# Patient Record
Sex: Female | Born: 1985 | Race: Black or African American | Hispanic: No | Marital: Married | Smoking: Never smoker
Health system: Southern US, Community
[De-identification: ages and names within clinical notes are randomized; demographics above are authoritative.]

## PROBLEM LIST (undated history)

## (undated) HISTORY — PX: NO PAST SURGERIES: SHX2092

---

## 2018-02-10 ENCOUNTER — Ambulatory Visit: Payer: Medicaid Other | Admitting: Obstetrics and Gynecology

## 2018-02-12 ENCOUNTER — Ambulatory Visit (INDEPENDENT_AMBULATORY_CARE_PROVIDER_SITE_OTHER): Payer: Medicaid Other | Admitting: *Deleted

## 2018-02-12 DIAGNOSIS — Z3201 Encounter for pregnancy test, result positive: Secondary | ICD-10-CM | POA: Diagnosis not present

## 2018-02-12 DIAGNOSIS — Z3200 Encounter for pregnancy test, result unknown: Secondary | ICD-10-CM

## 2018-02-12 LAB — POCT URINE PREGNANCY: Preg Test, Ur: POSITIVE — AB

## 2018-02-12 MED ORDER — PRENATE PIXIE 10-0.6-0.4-200 MG PO CAPS: 1.0000 | ORAL_CAPSULE | Freq: Every day | ORAL | 11 refills | Status: DC

## 2018-02-12 NOTE — Progress Notes (Signed)
Deborah Cardenas presents today for UPT. She has no unusual complaints. LMP: 12/14/17, approx date- pt states end of August    OBJECTIVE: Appears well, in no apparent distress.  OB History   None    Home UPT Result:N/A In-Office UPT result:Positive   ASSESSMENT: Positive pregnancy test  PLAN Prenatal care to be completed at: [facility] PNV ordered to pharmacy today.

## 2018-02-13 NOTE — Progress Notes (Signed)
I have reviewed the chart and agree with nursing staff's documentation of this patient's encounter.  Jaynie Collins, MD 02/13/2018 12:59 AM

## 2018-03-03 ENCOUNTER — Encounter: Payer: Self-pay | Admitting: Obstetrics and Gynecology

## 2018-03-03 ENCOUNTER — Other Ambulatory Visit (HOSPITAL_COMMUNITY)
Admission: RE | Admit: 2018-03-03 | Discharge: 2018-03-03 | Disposition: A | Discharge status: Home / Self Care | Payer: Medicaid Other | Source: Ambulatory Visit | Attending: Obstetrics and Gynecology | Admitting: Obstetrics and Gynecology

## 2018-03-03 ENCOUNTER — Ambulatory Visit (INDEPENDENT_AMBULATORY_CARE_PROVIDER_SITE_OTHER): Payer: Medicaid Other | Admitting: Obstetrics and Gynecology

## 2018-03-03 ENCOUNTER — Ambulatory Visit: Payer: Medicaid Other | Admitting: Obstetrics and Gynecology

## 2018-03-03 DIAGNOSIS — Z3480 Encounter for supervision of other normal pregnancy, unspecified trimester: Secondary | ICD-10-CM | POA: Insufficient documentation

## 2018-03-03 DIAGNOSIS — Z3481 Encounter for supervision of other normal pregnancy, first trimester: Secondary | ICD-10-CM

## 2018-03-03 MED ORDER — VITAFOL ULTRA 29-0.6-0.4-200 MG PO CAPS: 1.0000 | ORAL_CAPSULE | Freq: Every day | ORAL | 12 refills | Status: DC

## 2018-03-03 NOTE — Patient Instructions (Signed)
 First Trimester of Pregnancy The first trimester of pregnancy is from week 1 until the end of week 13 (months 1 through 3). A week after a sperm fertilizes an egg, the egg will implant on the wall of the uterus. This embryo will begin to develop into a baby. Genes from you and your partner will form the baby. The female genes will determine whether the baby will be a boy or a girl. At 6-8 weeks, the eyes and face will be formed, and the heartbeat can be seen on ultrasound. At the end of 12 weeks, all the baby's organs will be formed. Now that you are pregnant, you will want to do everything you can to have a healthy baby. Two of the most important things are to get good prenatal care and to follow your health care provider's instructions. Prenatal care is all the medical care you receive before the baby's birth. This care will help prevent, find, and treat any problems during the pregnancy and childbirth. Body changes during your first trimester Your body goes through many changes during pregnancy. The changes vary from woman to woman.  You may gain or lose a couple of pounds at first.  You may feel sick to your stomach (nauseous) and you may throw up (vomit). If the vomiting is uncontrollable, call your health care provider.  You may tire easily.  You may develop headaches that can be relieved by medicines. All medicines should be approved by your health care provider.  You may urinate more often. Painful urination may mean you have a bladder infection.  You may develop heartburn as a result of your pregnancy.  You may develop constipation because certain hormones are causing the muscles that push stool through your intestines to slow down.  You may develop hemorrhoids or swollen veins (varicose veins).  Your breasts may begin to grow larger and become tender. Your nipples may stick out more, and the tissue that surrounds them (areola) may become darker.  Your gums may bleed and may be  sensitive to brushing and flossing.  Dark spots or blotches (chloasma, mask of pregnancy) may develop on your face. This will likely fade after the baby is born.  Your menstrual periods will stop.  You may have a loss of appetite.  You may develop cravings for certain kinds of food.  You may have changes in your emotions from day to day, such as being excited to be pregnant or being concerned that something may go wrong with the pregnancy and baby.  You may have more vivid and strange dreams.  You may have changes in your hair. These can include thickening of your hair, rapid growth, and changes in texture. Some women also have hair loss during or after pregnancy, or hair that feels dry or thin. Your hair will most likely return to normal after your baby is born.  What to expect at prenatal visits During a routine prenatal visit:  You will be weighed to make sure you and the baby are growing normally.  Your blood pressure will be taken.  Your abdomen will be measured to track your baby's growth.  The fetal heartbeat will be listened to between weeks 10 and 14 of your pregnancy.  Test results from any previous visits will be discussed.  Your health care provider may ask you:  How you are feeling.  If you are feeling the baby move.  If you have had any abnormal symptoms, such as leaking fluid, bleeding, severe   headaches, or abdominal cramping.  If you are using any tobacco products, including cigarettes, chewing tobacco, and electronic cigarettes.  If you have any questions.  Other tests that may be performed during your first trimester include:  Blood tests to find your blood type and to check for the presence of any previous infections. The tests will also be used to check for low iron levels (anemia) and protein on red blood cells (Rh antibodies). Depending on your risk factors, or if you previously had diabetes during pregnancy, you may have tests to check for high blood  sugar that affects pregnant women (gestational diabetes).  Urine tests to check for infections, diabetes, or protein in the urine.  An ultrasound to confirm the proper growth and development of the baby.  Fetal screens for spinal cord problems (spina bifida) and Down syndrome.  HIV (human immunodeficiency virus) testing. Routine prenatal testing includes screening for HIV, unless you choose not to have this test.  You may need other tests to make sure you and the baby are doing well.  Follow these instructions at home: Medicines  Follow your health care provider's instructions regarding medicine use. Specific medicines may be either safe or unsafe to take during pregnancy.  Take a prenatal vitamin that contains at least 600 micrograms (mcg) of folic acid.  If you develop constipation, try taking a stool softener if your health care provider approves. Eating and drinking  Eat a balanced diet that includes fresh fruits and vegetables, whole grains, good sources of protein such as meat, eggs, or tofu, and low-fat dairy. Your health care provider will help you determine the amount of weight gain that is right for you.  Avoid raw meat and uncooked cheese. These carry germs that can cause birth defects in the baby.  Eating four or five small meals rather than three large meals a day may help relieve nausea and vomiting. If you start to feel nauseous, eating a few soda crackers can be helpful. Drinking liquids between meals, instead of during meals, also seems to help ease nausea and vomiting.  Limit foods that are high in fat and processed sugars, such as fried and sweet foods.  To prevent constipation: ? Eat foods that are high in fiber, such as fresh fruits and vegetables, whole grains, and beans. ? Drink enough fluid to keep your urine clear or pale yellow. Activity  Exercise only as directed by your health care provider. Most women can continue their usual exercise routine during  pregnancy. Try to exercise for 30 minutes at least 5 days a week. Exercising will help you: ? Control your weight. ? Stay in shape. ? Be prepared for labor and delivery.  Experiencing pain or cramping in the lower abdomen or lower back is a good sign that you should stop exercising. Check with your health care provider before continuing with normal exercises.  Try to avoid standing for long periods of time. Move your legs often if you must stand in one place for a long time.  Avoid heavy lifting.  Wear low-heeled shoes and practice good posture.  You may continue to have sex unless your health care provider tells you not to. Relieving pain and discomfort  Wear a good support bra to relieve breast tenderness.  Take warm sitz baths to soothe any pain or discomfort caused by hemorrhoids. Use hemorrhoid cream if your health care provider approves.  Rest with your legs elevated if you have leg cramps or low back pain.  If you   develop varicose veins in your legs, wear support hose. Elevate your feet for 15 minutes, 3-4 times a day. Limit salt in your diet. Prenatal care  Schedule your prenatal visits by the twelfth week of pregnancy. They are usually scheduled monthly at first, then more often in the last 2 months before delivery.  Write down your questions. Take them to your prenatal visits.  Keep all your prenatal visits as told by your health care provider. This is important. Safety  Wear your seat belt at all times when driving.  Make a list of emergency phone numbers, including numbers for family, friends, the hospital, and police and fire departments. General instructions  Ask your health care provider for a referral to a local prenatal education class. Begin classes no later than the beginning of month 6 of your pregnancy.  Ask for help if you have counseling or nutritional needs during pregnancy. Your health care provider can offer advice or refer you to specialists for help  with various needs.  Do not use hot tubs, steam rooms, or saunas.  Do not douche or use tampons or scented sanitary pads.  Do not cross your legs for long periods of time.  Avoid cat litter boxes and soil used by cats. These carry germs that can cause birth defects in the baby and possibly loss of the fetus by miscarriage or stillbirth.  Avoid all smoking, herbs, alcohol, and medicines not prescribed by your health care provider. Chemicals in these products affect the formation and growth of the baby.  Do not use any products that contain nicotine or tobacco, such as cigarettes and e-cigarettes. If you need help quitting, ask your health care provider. You may receive counseling support and other resources to help you quit.  Schedule a dentist appointment. At home, brush your teeth with a soft toothbrush and be gentle when you floss. Contact a health care provider if:  You have dizziness.  You have mild pelvic cramps, pelvic pressure, or nagging pain in the abdominal area.  You have persistent nausea, vomiting, or diarrhea.  You have a bad smelling vaginal discharge.  You have pain when you urinate.  You notice increased swelling in your face, hands, legs, or ankles.  You are exposed to fifth disease or chickenpox.  You are exposed to German measles (rubella) and have never had it. Get help right away if:  You have a fever.  You are leaking fluid from your vagina.  You have spotting or bleeding from your vagina.  You have severe abdominal cramping or pain.  You have rapid weight gain or loss.  You vomit blood or material that looks like coffee grounds.  You develop a severe headache.  You have shortness of breath.  You have any kind of trauma, such as from a fall or a car accident. Summary  The first trimester of pregnancy is from week 1 until the end of week 13 (months 1 through 3).  Your body goes through many changes during pregnancy. The changes vary from  woman to woman.  You will have routine prenatal visits. During those visits, your health care provider will examine you, discuss any test results you may have, and talk with you about how you are feeling. This information is not intended to replace advice given to you by your health care provider. Make sure you discuss any questions you have with your health care provider. Document Released: 04/02/2001 Document Revised: 03/20/2016 Document Reviewed: 03/20/2016 [employer] Interactive Patient Education  2018   [employer]   Second Trimester of Pregnancy The second trimester is from week 14 through week 27 (months 4 through 6). The second trimester is often a time when you feel your best. Your body has adjusted to being pregnant, and you begin to feel better physically. Usually, morning sickness has lessened or quit completely, you may have more energy, and you may have an increase in appetite. The second trimester is also a time when the fetus is growing rapidly. At the end of the sixth month, the fetus is about 9 inches long and weighs about 1 pounds. You will likely begin to feel the baby move (quickening) between 16 and 20 weeks of pregnancy. Body changes during your second trimester Your body continues to go through many changes during your second trimester. The changes vary from woman to woman.  Your weight will continue to increase. You will notice your lower abdomen bulging out.  You may begin to get stretch marks on your hips, abdomen, and breasts.  You may develop headaches that can be relieved by medicines. The medicines should be approved by your health care provider.  You may urinate more often because the fetus is pressing on your bladder.  You may develop or continue to have heartburn as a result of your pregnancy.  You may develop constipation because certain hormones are causing the muscles that push waste through your intestines to slow down.  You may develop hemorrhoids or  swollen, bulging veins (varicose veins).  You may have back pain. This is caused by: ? Weight gain. ? Pregnancy hormones that are relaxing the joints in your pelvis. ? A shift in weight and the muscles that support your balance.  Your breasts will continue to grow and they will continue to become tender.  Your gums may bleed and may be sensitive to brushing and flossing.  Dark spots or blotches (chloasma, mask of pregnancy) may develop on your face. This will likely fade after the baby is born.  A dark line from your belly button to the pubic area (linea nigra) may appear. This will likely fade after the baby is born.  You may have changes in your hair. These can include thickening of your hair, rapid growth, and changes in texture. Some women also have hair loss during or after pregnancy, or hair that feels dry or thin. Your hair will most likely return to normal after your baby is born.  What to expect at prenatal visits During a routine prenatal visit:  You will be weighed to make sure you and the fetus are growing normally.  Your blood pressure will be taken.  Your abdomen will be measured to track your baby's growth.  The fetal heartbeat will be listened to.  Any test results from the previous visit will be discussed.  Your health care provider may ask you:  How you are feeling.  If you are feeling the baby move.  If you have had any abnormal symptoms, such as leaking fluid, bleeding, severe headaches, or abdominal cramping.  If you are using any tobacco products, including cigarettes, chewing tobacco, and electronic cigarettes.  If you have any questions.  Other tests that may be performed during your second trimester include:  Blood tests that check for: ? Low iron levels (anemia). ? High blood sugar that affects pregnant women (gestational diabetes) between 24 and 28 weeks. ? Rh antibodies. This is to check for a protein on red blood cells (Rh factor).  Urine  tests to   check for infections, diabetes, or protein in the urine.  An ultrasound to confirm the proper growth and development of the baby.  An amniocentesis to check for possible genetic problems.  Fetal screens for spina bifida and Down syndrome.  HIV (human immunodeficiency virus) testing. Routine prenatal testing includes screening for HIV, unless you choose not to have this test.  Follow these instructions at home: Medicines  Follow your health care provider's instructions regarding medicine use. Specific medicines may be either safe or unsafe to take during pregnancy.  Take a prenatal vitamin that contains at least 600 micrograms (mcg) of folic acid.  If you develop constipation, try taking a stool softener if your health care provider approves. Eating and drinking  Eat a balanced diet that includes fresh fruits and vegetables, whole grains, good sources of protein such as meat, eggs, or tofu, and low-fat dairy. Your health care provider will help you determine the amount of weight gain that is right for you.  Avoid raw meat and uncooked cheese. These carry germs that can cause birth defects in the baby.  If you have low calcium intake from food, talk to your health care provider about whether you should take a daily calcium supplement.  Limit foods that are high in fat and processed sugars, such as fried and sweet foods.  To prevent constipation: ? Drink enough fluid to keep your urine clear or pale yellow. ? Eat foods that are high in fiber, such as fresh fruits and vegetables, whole grains, and beans. Activity  Exercise only as directed by your health care provider. Most women can continue their usual exercise routine during pregnancy. Try to exercise for 30 minutes at least 5 days a week. Stop exercising if you experience uterine contractions.  Avoid heavy lifting, wear low heel shoes, and practice good posture.  A sexual relationship may be continued unless your health  care provider directs you otherwise. Relieving pain and discomfort  Wear a good support bra to prevent discomfort from breast tenderness.  Take warm sitz baths to soothe any pain or discomfort caused by hemorrhoids. Use hemorrhoid cream if your health care provider approves.  Rest with your legs elevated if you have leg cramps or low back pain.  If you develop varicose veins, wear support hose. Elevate your feet for 15 minutes, 3-4 times a day. Limit salt in your diet. Prenatal Care  Write down your questions. Take them to your prenatal visits.  Keep all your prenatal visits as told by your health care provider. This is important. Safety  Wear your seat belt at all times when driving.  Make a list of emergency phone numbers, including numbers for family, friends, the hospital, and police and fire departments. General instructions  Ask your health care provider for a referral to a local prenatal education class. Begin classes no later than the beginning of month 6 of your pregnancy.  Ask for help if you have counseling or nutritional needs during pregnancy. Your health care provider can offer advice or refer you to specialists for help with various needs.  Do not use hot tubs, steam rooms, or saunas.  Do not douche or use tampons or scented sanitary pads.  Do not cross your legs for long periods of time.  Avoid cat litter boxes and soil used by cats. These carry germs that can cause birth defects in the baby and possibly loss of the fetus by miscarriage or stillbirth.  Avoid all smoking, herbs, alcohol, and unprescribed drugs. Chemicals   in these products can affect the formation and growth of the baby.  Do not use any products that contain nicotine or tobacco, such as cigarettes and e-cigarettes. If you need help quitting, ask your health care provider.  Visit your dentist if you have not gone yet during your pregnancy. Use a soft toothbrush to brush your teeth and be gentle when  you floss. Contact a health care provider if:  You have dizziness.  You have mild pelvic cramps, pelvic pressure, or nagging pain in the abdominal area.  You have persistent nausea, vomiting, or diarrhea.  You have a bad smelling vaginal discharge.  You have pain when you urinate. Get help right away if:  You have a fever.  You are leaking fluid from your vagina.  You have spotting or bleeding from your vagina.  You have severe abdominal cramping or pain.  You have rapid weight gain or weight loss.  You have shortness of breath with chest pain.  You notice sudden or extreme swelling of your face, hands, ankles, feet, or legs.  You have not felt your baby move in over an hour.  You have severe headaches that do not go away when you take medicine.  You have vision changes. Summary  The second trimester is from week 14 through week 27 (months 4 through 6). It is also a time when the fetus is growing rapidly.  Your body goes through many changes during pregnancy. The changes vary from woman to woman.  Avoid all smoking, herbs, alcohol, and unprescribed drugs. These chemicals affect the formation and growth your baby.  Do not use any tobacco products, such as cigarettes, chewing tobacco, and e-cigarettes. If you need help quitting, ask your health care provider.  Contact your health care provider if you have any questions. Keep all prenatal visits as told by your health care provider. This is important. This information is not intended to replace advice given to you by your health care provider. Make sure you discuss any questions you have with your health care provider. Document Released: 04/02/2001 Document Revised: 05/14/2016 Document Reviewed: 05/14/2016 [employer] Interactive Patient Education  2018 [employer]   Contraception Choices Contraception, also called birth control, refers to methods or devices that prevent pregnancy. Hormonal methods Contraceptive  implant A contraceptive implant is a thin, plastic tube that contains a hormone. It is inserted into the upper part of the arm. It can remain in place for up to 3 years. Progestin-only injections Progestin-only injections are injections of progestin, a synthetic form of the hormone progesterone. They are given every 3 months by a health care provider. Birth control pills Birth control pills are pills that contain hormones that prevent pregnancy. They must be taken once a day, preferably at the same time each day. Birth control patch The birth control patch contains hormones that prevent pregnancy. It is placed on the skin and must be changed once a week for three weeks and removed on the fourth week. A prescription is needed to use this method of contraception. Vaginal ring A vaginal ring contains hormones that prevent pregnancy. It is placed in the vagina for three weeks and removed on the fourth week. After that, the process is repeated with a new ring. A prescription is needed to use this method of contraception. Emergency contraceptive Emergency contraceptives prevent pregnancy after unprotected sex. They come in pill form and can be taken up to 5 days after sex. They work best the sooner they are taken after having   sex. Most emergency contraceptives are available without a prescription. This method should not be used as your only form of birth control. Barrier methods Female condom A female condom is a thin sheath that is worn over the penis during sex. Condoms keep sperm from going inside a woman's body. They can be used with a spermicide to increase their effectiveness. They should be disposed after a single use. Female condom A female condom is a soft, loose-fitting sheath that is put into the vagina before sex. The condom keeps sperm from going inside a woman's body. They should be disposed after a single use. Diaphragm A diaphragm is a soft, dome-shaped barrier. It is inserted into the vagina  before sex, along with a spermicide. The diaphragm blocks sperm from entering the uterus, and the spermicide kills sperm. A diaphragm should be left in the vagina for 6-8 hours after sex and removed within 24 hours. A diaphragm is prescribed and fitted by a health care provider. A diaphragm should be replaced every 1-2 years, after giving birth, after gaining more than 15 lb (6.8 kg), and after pelvic surgery. Cervical cap A cervical cap is a round, soft latex or plastic cup that fits over the cervix. It is inserted into the vagina before sex, along with spermicide. It blocks sperm from entering the uterus. The cap should be left in place for 6-8 hours after sex and removed within 48 hours. A cervical cap must be prescribed and fitted by a health care provider. It should be replaced every 2 years. Sponge A sponge is a soft, circular piece of polyurethane foam with spermicide on it. The sponge helps block sperm from entering the uterus, and the spermicide kills sperm. To use it, you make it wet and then insert it into the vagina. It should be inserted before sex, left in for at least 6 hours after sex, and removed and thrown away within 30 hours. Spermicides Spermicides are chemicals that kill or block sperm from entering the cervix and uterus. They can come as a cream, jelly, suppository, foam, or tablet. A spermicide should be inserted into the vagina with an applicator at least 10-15 minutes before sex to allow time for it to work. The process must be repeated every time you have sex. Spermicides do not require a prescription. Intrauterine contraception Intrauterine device (IUD) An IUD is a T-shaped device that is put in a woman's uterus. There are two types:  Hormone IUD.This type contains progestin, a synthetic form of the hormone progesterone. This type can stay in place for 3-5 years.  Copper IUD.This type is wrapped in copper wire. It can stay in place for 10 years.  Permanent methods of  contraception Female tubal ligation In this method, a woman's fallopian tubes are sealed, tied, or blocked during surgery to prevent eggs from traveling to the uterus. Hysteroscopic sterilization In this method, a small, flexible insert is placed into each fallopian tube. The inserts cause scar tissue to form in the fallopian tubes and block them, so sperm cannot reach an egg. The procedure takes about 3 months to be effective. Another form of birth control must be used during those 3 months. Female sterilization This is a procedure to tie off the tubes that carry sperm (vasectomy). After the procedure, the man can still ejaculate fluid (semen). Natural planning methods Natural family planning In this method, a couple does not have sex on days when the woman could become pregnant. Calendar method This means keeping track of the   length of each menstrual cycle, identifying the days when pregnancy can happen, and not having sex on those days. Ovulation method In this method, a couple avoids sex during ovulation. Symptothermal method This method involves not having sex during ovulation. The woman typically checks for ovulation by watching changes in her temperature and in the consistency of cervical mucus. Post-ovulation method In this method, a couple waits to have sex until after ovulation. Summary  Contraception, also called birth control, means methods or devices that prevent pregnancy.  Hormonal methods of contraception include implants, injections, pills, patches, vaginal rings, and emergency contraceptives.  Barrier methods of contraception can include female condoms, female condoms, diaphragms, cervical caps, sponges, and spermicides.  There are two types of IUDs (intrauterine devices). An IUD can be put in a woman's uterus to prevent pregnancy for 3-5 years.  Permanent sterilization can be done through a procedure for males, females, or both.  Natural family planning methods involve  not having sex on days when the woman could become pregnant. This information is not intended to replace advice given to you by your health care provider. Make sure you discuss any questions you have with your health care provider. Document Released: 04/08/2005 Document Revised: 05/11/2016 Document Reviewed: 05/11/2016 Elsevier Interactive Patient Education  2018 Else[employer] Inc.   Breastfeeding Choosing to breastfeed is one of the best decisions you can make for yourself and your baby. A change in hormones during pregnancy causes your breasts to make breast milk in your milk-producing glands. Hormones prevent breast milk from being released before your baby is born. They also prompt milk flow after birth. Once breastfeeding has begun, thoughts of your baby, as well as his or her sucking or crying, can stimulate the release of milk from your milk-producing glands. Benefits of breastfeeding Research shows that breastfeeding offers many health benefits for infants and mothers. It also offers a cost-free and convenient way to feed your baby. For your baby  Your first milk (colostrum) helps your baby's digestive system to function better.  Special cells in your milk (antibodies) help your baby to fight off infections.  Breastfed babies are less likely to develop asthma, allergies, obesity, or type 2 diabetes. They are also at lower risk for sudden infant death syndrome (SIDS).  Nutrients in breast milk are better able to meet your baby's needs compared to infant formula.  Breast milk improves your baby's brain development. For you  Breastfeeding helps to create a very special bond between you and your baby.  Breastfeeding is convenient. Breast milk costs nothing and is always available at the correct temperature.  Breastfeeding helps to burn calories. It helps you to lose the weight that you gained during pregnancy.  Breastfeeding makes your uterus return faster to its size before pregnancy.  It also slows bleeding (lochia) after you give birth.  Breastfeeding helps to lower your risk of developing type 2 diabetes, osteoporosis, rheumatoid arthritis, cardiovascular disease, and breast, ovarian, uterine, and endometrial cancer later in life. Breastfeeding basics Starting breastfeeding  Find a comfortable place to sit or lie down, with your neck and back well-supported.  Place a pillow or a rolled-up blanket under your baby to bring him or her to the level of your breast (if you are seated). Nursing pillows are specially designed to help support your arms and your baby while you breastfeed.  Make sure that your baby's tummy (abdomen) is facing your abdomen.  Gently massage your breast. With your fingertips, massage from the outer edges of   your breast inward toward the nipple. This encourages milk flow. If your milk flows slowly, you may need to continue this action during the feeding.  Support your breast with 4 fingers underneath and your thumb above your nipple (make the letter "C" with your hand). Make sure your fingers are well away from your nipple and your baby's mouth.  Stroke your baby's lips gently with your finger or nipple.  When your baby's mouth is open wide enough, quickly bring your baby to your breast, placing your entire nipple and as much of the areola as possible into your baby's mouth. The areola is the colored area around your nipple. ? More areola should be visible above your baby's upper lip than below the lower lip. ? Your baby's lips should be opened and extended outward (flanged) to ensure an adequate, comfortable latch. ? Your baby's tongue should be between his or her lower gum and your breast.  Make sure that your baby's mouth is correctly positioned around your nipple (latched). Your baby's lips should create a seal on your breast and be turned out (everted).  It is common for your baby to suck about 2-3 minutes in order to start the flow of breast  milk. Latching Teaching your baby how to latch onto your breast properly is very important. An improper latch can cause nipple pain, decreased milk supply, and poor weight gain in your baby. Also, if your baby is not latched onto your nipple properly, he or she may swallow some air during feeding. This can make your baby fussy. Burping your baby when you switch breasts during the feeding can help to get rid of the air. However, teaching your baby to latch on properly is still the best way to prevent fussiness from swallowing air while breastfeeding. Signs that your baby has successfully latched onto your nipple  Silent tugging or silent sucking, without causing you pain. Infant's lips should be extended outward (flanged).  Swallowing heard between every 3-4 sucks once your milk has started to flow (after your let-down milk reflex occurs).  Muscle movement above and in front of his or her ears while sucking.  Signs that your baby has not successfully latched onto your nipple  Sucking sounds or smacking sounds from your baby while breastfeeding.  Nipple pain.  If you think your baby has not latched on correctly, slip your finger into the corner of your baby's mouth to break the suction and place it between your baby's gums. Attempt to start breastfeeding again. Signs of successful breastfeeding Signs from your baby  Your baby will gradually decrease the number of sucks or will completely stop sucking.  Your baby will fall asleep.  Your baby's body will relax.  Your baby will retain a small amount of milk in his or her mouth.  Your baby will let go of your breast by himself or herself.  Signs from you  Breasts that have increased in firmness, weight, and size 1-3 hours after feeding.  Breasts that are softer immediately after breastfeeding.  Increased milk volume, as well as a change in milk consistency and color by the fifth day of breastfeeding.  Nipples that are not sore,  cracked, or bleeding.  Signs that your baby is getting enough milk  Wetting at least 1-2 diapers during the first 24 hours after birth.  Wetting at least 5-6 diapers every 24 hours for the first week after birth. The urine should be clear or pale yellow by the age of 5   days.  Wetting 6-8 diapers every 24 hours as your baby continues to grow and develop.  At least 3 stools in a 24-hour period by the age of 5 days. The stool should be soft and yellow.  At least 3 stools in a 24-hour period by the age of 7 days. The stool should be seedy and yellow.  No loss of weight greater than 10% of birth weight during the first 3 days of life.  Average weight gain of 4-7 oz (113-198 g) per week after the age of 4 days.  Consistent daily weight gain by the age of 5 days, without weight loss after the age of 2 weeks. After a feeding, your baby may spit up a small amount of milk. This is normal. Breastfeeding frequency and duration Frequent feeding will help you make more milk and can prevent sore nipples and extremely full breasts (breast engorgement). Breastfeed when you feel the need to reduce the fullness of your breasts or when your baby shows signs of hunger. This is called "breastfeeding on demand." Signs that your baby is hungry include:  Increased alertness, activity, or restlessness.  Movement of the head from side to side.  Opening of the mouth when the corner of the mouth or cheek is stroked (rooting).  Increased sucking sounds, smacking lips, cooing, sighing, or squeaking.  Hand-to-mouth movements and sucking on fingers or hands.  Fussing or crying.  Avoid introducing a pacifier to your baby in the first 4-6 weeks after your baby is born. After this time, you may choose to use a pacifier. Research has shown that pacifier use during the first year of a baby's life decreases the risk of sudden infant death syndrome (SIDS). Allow your baby to feed on each breast as long as he or she  wants. When your baby unlatches or falls asleep while feeding from the first breast, offer the second breast. Because newborns are often sleepy in the first few weeks of life, you may need to awaken your baby to get him or her to feed. Breastfeeding times will vary from baby to baby. However, the following rules can serve as a guide to help you make sure that your baby is properly fed:  Newborns (babies 4 weeks of age or younger) may breastfeed every 1-3 hours.  Newborns should not go without breastfeeding for longer than 3 hours during the day or 5 hours during the night.  You should breastfeed your baby a minimum of 8 times in a 24-hour period.  Breast milk pumping Pumping and storing breast milk allows you to make sure that your baby is exclusively fed your breast milk, even at times when you are unable to breastfeed. This is especially important if you go back to work while you are still breastfeeding, or if you are not able to be present during feedings. Your lactation consultant can help you find a method of pumping that works best for you and give you guidelines about how long it is safe to store breast milk. Caring for your breasts while you breastfeed Nipples can become dry, cracked, and sore while breastfeeding. The following recommendations can help keep your breasts moisturized and healthy:  Avoid using soap on your nipples.  Wear a supportive bra designed especially for nursing. Avoid wearing underwire-style bras or extremely tight bras (sports bras).  Air-dry your nipples for 3-4 minutes after each feeding.  Use only cotton bra pads to absorb leaked breast milk. Leaking of breast milk between feedings is normal.    Use lanolin on your nipples after breastfeeding. Lanolin helps to maintain your skin's normal moisture barrier. Pure lanolin is not harmful (not toxic) to your baby. You may also hand express a few drops of breast milk and gently massage that milk into your nipples and  allow the milk to air-dry.  In the first few weeks after giving birth, some women experience breast engorgement. Engorgement can make your breasts feel heavy, warm, and tender to the touch. Engorgement peaks within 3-5 days after you give birth. The following recommendations can help to ease engorgement:  Completely empty your breasts while breastfeeding or pumping. You may want to start by applying warm, moist heat (in the shower or with warm, water-soaked hand towels) just before feeding or pumping. This increases circulation and helps the milk flow. If your baby does not completely empty your breasts while breastfeeding, pump any extra milk after he or she is finished.  Apply ice packs to your breasts immediately after breastfeeding or pumping, unless this is too uncomfortable for you. To do this: ? Put ice in a plastic bag. ? Place a towel between your skin and the bag. ? Leave the ice on for 20 minutes, 2-3 times a day.  Make sure that your baby is latched on and positioned properly while breastfeeding.  If engorgement persists after 48 hours of following these recommendations, contact your health care provider or a lactation consultant. Overall health care recommendations while breastfeeding  Eat 3 healthy meals and 3 snacks every day. Well-nourished mothers who are breastfeeding need an additional 450-500 calories a day. You can meet this requirement by increasing the amount of a balanced diet that you eat.  Drink enough water to keep your urine pale yellow or clear.  Rest often, relax, and continue to take your prenatal vitamins to prevent fatigue, stress, and low vitamin and mineral levels in your body (nutrient deficiencies).  Do not use any products that contain nicotine or tobacco, such as cigarettes and e-cigarettes. Your baby may be harmed by chemicals from cigarettes that pass into breast milk and exposure to secondhand smoke. If you need help quitting, ask your health care  provider.  Avoid alcohol.  Do not use illegal drugs or marijuana.  Talk with your health care provider before taking any medicines. These include over-the-counter and prescription medicines as well as vitamins and herbal supplements. Some medicines that may be harmful to your baby can pass through breast milk.  It is possible to become pregnant while breastfeeding. If birth control is desired, ask your health care provider about options that will be safe while breastfeeding your baby. Where to find more information: [employer]: www.llli.org Contact a health care provider if:  You feel like you want to stop breastfeeding or have become frustrated with breastfeeding.  Your nipples are cracked or bleeding.  Your breasts are red, tender, or warm.  You have: ? Painful breasts or nipples. ? A swollen area on either breast. ? A fever or chills. ? Nausea or vomiting. ? Drainage other than breast milk from your nipples.  Your breasts do not become full before feedings by the fifth day after you give birth.  You feel sad and depressed.  Your baby is: ? Too sleepy to eat well. ? Having trouble sleeping. ? More than 1 week old and wetting fewer than 6 diapers in a 24-hour period. ? Not gaining weight by 5 days of age.  Your baby has fewer than 3 stools in a   24-hour period.  Your baby's skin or the white parts of his or her eyes become yellow. Get help right away if:  Your baby is overly tired (lethargic) and does not want to wake up and feed.  Your baby develops an unexplained fever. Summary  Breastfeeding offers many health benefits for infant and mothers.  Try to breastfeed your infant when he or she shows early signs of hunger.  Gently tickle or stroke your baby's lips with your finger or nipple to allow the baby to open his or her mouth. Bring the baby to your breast. Make sure that much of the areola is in your baby's mouth. Offer one side and burp the  baby before you offer the other side.  Talk with your health care provider or lactation consultant if you have questions or you face problems as you breastfeed. This information is not intended to replace advice given to you by your health care provider. Make sure you discuss any questions you have with your health care provider. Document Released: 04/08/2005 Document Revised: 05/10/2016 Document Reviewed: 05/10/2016 Elsevier Interactive Patient Education  2018 Elsevier Inc.  

## 2018-03-03 NOTE — Progress Notes (Signed)
  Subjective:    Deborah Cardenas is a G1P0 6648w2d being seen today for her first obstetrical visit.  Her obstetrical history is significant for previous uncomplicated full term vaginal delivery . Patient does intend to breast feed. Pregnancy history fully reviewed.  Patient reports nausea.  Vitals:   03/03/18 0837  BP: 116/65  Pulse: 79  Weight: 144 lb (65.3 kg)    HISTORY: OB History  Gravida Para Term Preterm AB Living  2 1 1     1   SAB TAB Ectopic Multiple Live Births          1    # Outcome Date GA Lbr Len/2nd Weight Sex Delivery Anes PTL Lv  2 Current           1 Term 06/19/15    M Vag-Spont   LIV   History reviewed. No pertinent past medical history. Past Surgical History:  Procedure Laterality Date  . NO PAST SURGERIES     History reviewed. No pertinent family history.   Exam    Uterus:  Fundal Height: 14 cm  Pelvic Exam:    Perineum: No Hemorrhoids, Normal Perineum   Vulva: normal   Vagina:  normal mucosa, normal discharge   pH:    Cervix: nulliparous appearance and cervix is closed and long   Adnexa: normal adnexa and no mass, fullness, tenderness   Bony Pelvis: gynecoid  System: Breast:  normal appearance, no masses or tenderness   Skin: normal coloration and turgor, no rashes    Neurologic: oriented, no focal deficits   Extremities: normal strength, tone, and muscle mass   HEENT extra ocular movement intact   Mouth/Teeth mucous membranes moist, pharynx normal without lesions and dental hygiene good   Neck supple and no masses   Cardiovascular: regular rate and rhythm   Respiratory:  appears well, vitals normal, no respiratory distress, acyanotic, normal RR, chest clear, no wheezing, crepitations, rhonchi, normal symmetric air entry   Abdomen: soft, non-tender; bowel sounds normal; no masses,  no organomegaly   Urinary:       Assessment:    Pregnancy: G1P0 Patient Active Problem List   Diagnosis Date Noted  . Supervision of other normal  pregnancy, antepartum 03/03/2018        Plan:     Initial labs drawn. Prenatal vitamins. Problem list reviewed and updated. Genetic Screening discussed : panorama ordered.  Ultrasound discussed; fetal survey: ordered.  Follow up in 4 weeks. 50% of 30 min visit spent on counseling and coordination of care.     Madisin Hasan 03/03/2018

## 2018-03-04 NOTE — Progress Notes (Signed)
Patient was not seen.

## 2018-03-05 LAB — URINE CULTURE, OB REFLEX

## 2018-03-05 LAB — CULTURE, OB URINE

## 2018-03-06 LAB — CYTOLOGY - PAP
Chlamydia: NEGATIVE
Diagnosis: NEGATIVE
HPV (WINDOPATH): NOT DETECTED
NEISSERIA GONORRHEA: NEGATIVE

## 2018-03-10 ENCOUNTER — Encounter: Payer: Self-pay | Admitting: Obstetrics and Gynecology

## 2018-03-11 LAB — OBSTETRIC PANEL, INCLUDING HIV
Antibody Screen: NEGATIVE
BASOS ABS: 0 10*3/uL (ref 0.0–0.2)
Basos: 0 %
EOS (ABSOLUTE): 0 10*3/uL (ref 0.0–0.4)
Eos: 1 %
HEP B S AG: NEGATIVE
HIV SCREEN 4TH GENERATION: NONREACTIVE
Hematocrit: 35.3 % (ref 34.0–46.6)
Hemoglobin: 12.3 g/dL (ref 11.1–15.9)
Immature Grans (Abs): 0 10*3/uL (ref 0.0–0.1)
Immature Granulocytes: 0 %
Lymphocytes Absolute: 1.6 10*3/uL (ref 0.7–3.1)
Lymphs: 23 %
MCH: 32 pg (ref 26.6–33.0)
MCHC: 34.8 g/dL (ref 31.5–35.7)
MCV: 92 fL (ref 79–97)
MONOCYTES: 3 %
Monocytes Absolute: 0.2 10*3/uL (ref 0.1–0.9)
NEUTROS ABS: 5.2 10*3/uL (ref 1.4–7.0)
Neutrophils: 73 %
Platelets: 262 10*3/uL (ref 150–450)
RBC: 3.84 x10E6/uL (ref 3.77–5.28)
RDW: 13.1 % (ref 12.3–15.4)
RPR: NONREACTIVE
RUBELLA: 27.8 {index} (ref >0.99)
Rh Factor: POSITIVE
WBC: 7.1 10*3/uL (ref 3.4–10.8)

## 2018-03-11 LAB — HEMOGLOBINOPATHY EVALUATION
HGB C: 0 %
HGB S: 0 %
HGB VARIANT: 0 %
Hemoglobin A2 Quantitation: 2.4 % (ref 1.8–3.2)
Hemoglobin F Quantitation: 0 % (ref 0.0–2.0)
Hgb A: 97.6 % (ref 96.4–98.8)

## 2018-03-11 LAB — SMN1 COPY NUMBER ANALYSIS (SMA CARRIER SCREENING)

## 2018-03-11 LAB — CYSTIC FIBROSIS MUTATION 97: Interpretation: NOT DETECTED

## 2018-03-31 ENCOUNTER — Encounter: Payer: Self-pay | Admitting: Obstetrics and Gynecology

## 2018-03-31 ENCOUNTER — Ambulatory Visit (INDEPENDENT_AMBULATORY_CARE_PROVIDER_SITE_OTHER): Payer: Medicaid Other | Admitting: Obstetrics and Gynecology

## 2018-03-31 VITALS — BP 113/70 | HR 96 | Wt 157.0 lb

## 2018-03-31 DIAGNOSIS — Z3482 Encounter for supervision of other normal pregnancy, second trimester: Secondary | ICD-10-CM

## 2018-03-31 DIAGNOSIS — Z789 Other specified health status: Secondary | ICD-10-CM

## 2018-03-31 DIAGNOSIS — Z3480 Encounter for supervision of other normal pregnancy, unspecified trimester: Secondary | ICD-10-CM

## 2018-03-31 DIAGNOSIS — Z758 Other problems related to medical facilities and other health care: Secondary | ICD-10-CM | POA: Insufficient documentation

## 2018-03-31 NOTE — Progress Notes (Signed)
Pt states she wants to discuss results.

## 2018-03-31 NOTE — Progress Notes (Signed)
   PRENATAL VISIT NOTE  Subjective:  Deborah Cardenas is a 32 y.o. G2P1001 at 8166w2d being seen today for ongoing prenatal care.  She is currently monitored for the following issues for this low-risk pregnancy and has Supervision of other normal pregnancy, antepartum and Language barrier on their problem list.  Patient reports no complaints.  Contractions: Not present. Vag. Bleeding: None.  Movement: Absent. Denies leaking of fluid.   The following portions of the patient's history were reviewed and updated as appropriate: allergies, current medications, past family history, past medical history, past social history, past surgical history and problem list. Problem list updated.  Objective:   Vitals:   03/31/18 0845  BP: 113/70  Pulse: 96  Weight: 157 lb (71.2 kg)    Fetal Status: Fetal Heart Rate (bpm): 144   Movement: Absent     General:  Alert, oriented and cooperative. Patient is in no acute distress.  Skin: Skin is warm and dry. No rash noted.   Cardiovascular: Normal heart rate noted  Respiratory: Normal respiratory effort, no problems with respiration noted  Abdomen: Soft, gravid, appropriate for gestational age.  Pain/Pressure: Absent     Pelvic: Cervical exam deferred        Extremities: Normal range of motion.  Edema: None  Mental Status: Normal mood and affect. Normal behavior. Normal judgment and thought content.   Assessment and Plan:  Pregnancy: G2P1001 at 7766w2d  1. Supervision of other normal pregnancy, antepartum Patient is doing well without complaints AFP today Reviewed NIPS results with patient Anatomy ultrasound 1/6  2. Language barrier Patient spoken to in [place] Interpreter was present as well  Preterm labor symptoms and general obstetric precautions including but not limited to vaginal bleeding, contractions, leaking of fluid and fetal movement were reviewed in detail with the patient. Please refer to After Visit Summary for other counseling  recommendations.  No follow-ups on file.  Future Appointments  Date Time Provider Department Center  04/27/2018  9:30 AM [facility] US 1 [facility]    Catalina AntiguaPeggy Yoneko Talerico, MD

## 2018-04-02 LAB — AFP, SERUM, OPEN SPINA BIFIDA
AFP MoM: 1.06
AFP VALUE AFPOSL: 33.8 ng/mL
Gest. Age on Collection Date: 15.2 weeks
MATERNAL AGE AT EDD: 33.1 a
OSBR RISK 1 IN: 10000
Test Results:: NEGATIVE
WEIGHT: 157 [lb_av]

## 2018-04-20 ENCOUNTER — Encounter (HOSPITAL_COMMUNITY): Payer: Self-pay

## 2018-04-22 NOTE — L&D Delivery Note (Signed)
Patient is a 33 y.o. now G2P1 s/p NSVD at [redacted]w[redacted]d, who was admitted for SOL.  She progressed with augmentation (AROM, Pitocin) to complete and pushed 1hr to deliver.  Cord clamping delayed by several minutes then clamped by CNM and cut by patient.  Placenta intact and spontaneous, bleeding minimal.   Mom and baby stable prior to transfer to postpartum. She plans on breastfeeding. She requests IUD (paragard) for birth control. IUD placed after delivery of placenta. See separate procedure note.  Delivery Note At 11:19 PM a viable and healthy female was delivered via Vaginal, Spontaneous (Presentation: LOA ).  APGAR: 8, 9; weight pending .   Placenta intact and spontaneous, bleeding minimal. 3V Cord:  With no complications: .  Cord pH: pending  Anesthesia:  Epidural  Episiotomy: None Lacerations: None Suture Repair: None Est. Blood Loss (mL): 74  Mom to postpartum.  Baby to Couplet care / Skin to Skin.  Sharyon Cable CNM 09/06/2018, 11:51 PM

## 2018-04-27 ENCOUNTER — Other Ambulatory Visit (HOSPITAL_COMMUNITY): Payer: Self-pay | Admitting: *Deleted

## 2018-04-27 ENCOUNTER — Ambulatory Visit (INDEPENDENT_AMBULATORY_CARE_PROVIDER_SITE_OTHER): Payer: Medicaid Other | Admitting: Obstetrics and Gynecology

## 2018-04-27 ENCOUNTER — Encounter: Payer: Self-pay | Admitting: Obstetrics and Gynecology

## 2018-04-27 ENCOUNTER — Ambulatory Visit (HOSPITAL_COMMUNITY)
Admission: RE | Admit: 2018-04-27 | Discharge: 2018-04-27 | Disposition: A | Discharge status: Home / Self Care | Payer: Medicaid Other | Source: Ambulatory Visit | Attending: Obstetrics and Gynecology | Admitting: Obstetrics and Gynecology

## 2018-04-27 VITALS — BP 95/71 | HR 81 | Wt 160.0 lb

## 2018-04-27 DIAGNOSIS — Z3480 Encounter for supervision of other normal pregnancy, unspecified trimester: Secondary | ICD-10-CM

## 2018-04-27 DIAGNOSIS — Z789 Other specified health status: Secondary | ICD-10-CM

## 2018-04-27 DIAGNOSIS — Z362 Encounter for other antenatal screening follow-up: Secondary | ICD-10-CM

## 2018-04-27 DIAGNOSIS — Z3A21 21 weeks gestation of pregnancy: Secondary | ICD-10-CM | POA: Diagnosis not present

## 2018-04-27 DIAGNOSIS — Z363 Encounter for antenatal screening for malformations: Secondary | ICD-10-CM

## 2018-04-27 DIAGNOSIS — Z3482 Encounter for supervision of other normal pregnancy, second trimester: Secondary | ICD-10-CM

## 2018-04-27 NOTE — Progress Notes (Signed)
Patient reports fetal movement, denies pain. 

## 2018-04-27 NOTE — Progress Notes (Signed)
   PRENATAL VISIT NOTE  Subjective:  Deborah Cardenas is a 33 y.o. G2P1001 at [redacted]w[redacted]d being seen today for ongoing prenatal care.  She is currently monitored for the following issues for this low-risk pregnancy and has Supervision of other normal pregnancy, antepartum and Language barrier on their problem list.  Patient reports no complaints.  Contractions: Not present. Vag. Bleeding: None.  Movement: Present. Denies leaking of fluid.   The following portions of the patient's history were reviewed and updated as appropriate: allergies, current medications, past family history, past medical history, past social history, past surgical history and problem list. Problem list updated.  Objective:   Vitals:   04/27/18 0822  BP: 95/71  Pulse: 81  Weight: 160 lb (72.6 kg)    Fetal Status: Fetal Heart Rate (bpm): 148 Fundal Height: 20 cm Movement: Present     General:  Alert, oriented and cooperative. Patient is in no acute distress.  Skin: Skin is warm and dry. No rash noted.   Cardiovascular: Normal heart rate noted  Respiratory: Normal respiratory effort, no problems with respiration noted  Abdomen: Soft, gravid, appropriate for gestational age.  Pain/Pressure: Absent     Pelvic: Cervical exam deferred        Extremities: Normal range of motion.  Edema: None  Mental Status: Normal mood and affect. Normal behavior. Normal judgment and thought content.   Assessment and Plan:  Pregnancy: G2P1001 at [redacted]w[redacted]d  1. Supervision of other normal pregnancy, antepartum Patient is doing well without complaints Anatomy ultrasound today  2. Language barrier Patient spoken to in [place]  Preterm labor symptoms and general obstetric precautions including but not limited to vaginal bleeding, contractions, leaking of fluid and fetal movement were reviewed in detail with the patient. Please refer to After Visit Summary for other counseling recommendations.  Return in about 4 weeks (around 05/25/2018) for  ROB.  Future Appointments  Date Time Provider Department Center  04/27/2018  9:30 AM [facility] [place] 1 [facility]    Catalina Antigua, MD

## 2018-05-25 ENCOUNTER — Ambulatory Visit (INDEPENDENT_AMBULATORY_CARE_PROVIDER_SITE_OTHER): Payer: Medicaid Other | Admitting: Obstetrics and Gynecology

## 2018-05-25 ENCOUNTER — Encounter: Payer: Self-pay | Admitting: Obstetrics and Gynecology

## 2018-05-25 DIAGNOSIS — Z3482 Encounter for supervision of other normal pregnancy, second trimester: Secondary | ICD-10-CM

## 2018-05-25 DIAGNOSIS — Z3480 Encounter for supervision of other normal pregnancy, unspecified trimester: Secondary | ICD-10-CM

## 2018-05-25 MED ORDER — FAMOTIDINE 20 MG PO TABS: 20.0000 mg | ORAL_TABLET | Freq: Two times a day (BID) | ORAL | 3 refills | Status: DC

## 2018-05-25 MED ORDER — DOCUSATE SODIUM 100 MG PO CAPS: 100.0000 mg | ORAL_CAPSULE | Freq: Two times a day (BID) | ORAL | 2 refills | Status: DC | PRN

## 2018-05-25 NOTE — Progress Notes (Signed)
Pt is here for ROB. [redacted]w[redacted]d.  

## 2018-05-25 NOTE — Progress Notes (Signed)
   PRENATAL VISIT NOTE  Subjective:  Deborah Cardenas is a 33 y.o. G2P1001 at [redacted]w[redacted]d being seen today for ongoing prenatal care.  She is currently monitored for the following issues for this low-risk pregnancy and has Supervision of other normal pregnancy, antepartum and Language barrier on their problem list.  Patient reports upper epigastric pain and hemorrhoid irritation.  Contractions: Not present. Vag. Bleeding: None.  Movement: Present. Denies leaking of fluid.   The following portions of the patient's history were reviewed and updated as appropriate: allergies, current medications, past family history, past medical history, past social history, past surgical history and problem list. Problem list updated.  Objective:   Vitals:   05/25/18 0813  BP: 111/69  Pulse: 84  Weight: 163 lb (73.9 kg)    Fetal Status: Fetal Heart Rate (bpm): 148 Fundal Height: 26 cm Movement: Present     General:  Alert, oriented and cooperative. Patient is in no acute distress.  Skin: Skin is warm and dry. No rash noted.   Cardiovascular: Normal heart rate noted  Respiratory: Normal respiratory effort, no problems with respiration noted  Abdomen: Soft, gravid, appropriate for gestational age.  Pain/Pressure: Absent     Pelvic: Cervical exam deferred        Extremities: Normal range of motion.  Edema: None  Mental Status: Normal mood and affect. Normal behavior. Normal judgment and thought content.   Assessment and Plan:  Pregnancy: G2P1001 at [redacted]w[redacted]d  1. Supervision of other normal pregnancy, antepartum Patient is doing well without  Rx Colace and and pepcid provided Patient advised to use preparation H Third trimester labs next visit Follow up growth and anatomy ultrasound on 2/10  Preterm labor symptoms and general obstetric precautions including but not limited to vaginal bleeding, contractions, leaking of fluid and fetal movement were reviewed in detail with the patient. Please refer to After  Visit Summary for other counseling recommendations.  Return in about 3 weeks (around 06/15/2018) for ROB, 2 hr glucola next visit.  Future Appointments  Date Time Provider Department Center  06/01/2018  8:45 AM [facility] [place] 2 [facility]    Catalina Antigua, MD

## 2018-06-01 ENCOUNTER — Ambulatory Visit (HOSPITAL_COMMUNITY)
Admission: RE | Admit: 2018-06-01 | Discharge: 2018-06-01 | Disposition: A | Discharge status: Home / Self Care | Payer: Medicaid Other | Source: Ambulatory Visit | Attending: Obstetrics and Gynecology | Admitting: Obstetrics and Gynecology

## 2018-06-01 DIAGNOSIS — Z362 Encounter for other antenatal screening follow-up: Secondary | ICD-10-CM | POA: Insufficient documentation

## 2018-06-01 DIAGNOSIS — Z3A26 26 weeks gestation of pregnancy: Secondary | ICD-10-CM

## 2018-06-15 ENCOUNTER — Ambulatory Visit (INDEPENDENT_AMBULATORY_CARE_PROVIDER_SITE_OTHER): Payer: Medicaid Other | Admitting: Obstetrics and Gynecology

## 2018-06-15 ENCOUNTER — Other Ambulatory Visit: Payer: Medicaid Other

## 2018-06-15 ENCOUNTER — Encounter: Payer: Self-pay | Admitting: Obstetrics and Gynecology

## 2018-06-15 DIAGNOSIS — Z3A28 28 weeks gestation of pregnancy: Secondary | ICD-10-CM

## 2018-06-15 DIAGNOSIS — Z3483 Encounter for supervision of other normal pregnancy, third trimester: Secondary | ICD-10-CM

## 2018-06-15 DIAGNOSIS — Z3480 Encounter for supervision of other normal pregnancy, unspecified trimester: Secondary | ICD-10-CM

## 2018-06-15 DIAGNOSIS — Z23 Encounter for immunization: Secondary | ICD-10-CM | POA: Diagnosis not present

## 2018-06-15 NOTE — Progress Notes (Signed)
   PRENATAL VISIT NOTE  Subjective:  Deborah Cardenas is a 33 y.o. G2P1001 at [redacted]w[redacted]d being seen today for ongoing prenatal care.  She is currently monitored for the following issues for this low-risk pregnancy and has Supervision of other normal pregnancy, antepartum and Language barrier on their problem list.  Patient reports generalized fatigue.  Contractions: Not present. Vag. Bleeding: None.  Movement: Present. Denies leaking of fluid.   The following portions of the patient's history were reviewed and updated as appropriate: allergies, current medications, past family history, past medical history, past social history, past surgical history and problem list. Problem list updated.  Objective:   Vitals:   06/15/18 0810  BP: 90/64  Pulse: 80  Weight: 165 lb 14.4 oz (75.3 kg)    Fetal Status: Fetal Heart Rate (bpm): 153 Fundal Height: 29 cm Movement: Present     General:  Alert, oriented and cooperative. Patient is in no acute distress.  Skin: Skin is warm and dry. No rash noted.   Cardiovascular: Normal heart rate noted  Respiratory: Normal respiratory effort, no problems with respiration noted  Abdomen: Soft, gravid, appropriate for gestational age.  Pain/Pressure: Absent     Pelvic: Cervical exam deferred        Extremities: Normal range of motion.  Edema: None  Mental Status: Normal mood and affect. Normal behavior. Normal judgment and thought content.   Assessment and Plan:  Pregnancy: G2P1001 at [redacted]w[redacted]d  1. Supervision of other normal pregnancy, antepartum Patient is doing well. She denies insomnia or depressive symptoms. She is unable to keep up with her 33 year old Third trimester labs today Will check H&H Tdap today Patient planning on changing pediatrician  Preterm labor symptoms and general obstetric precautions including but not limited to vaginal bleeding, contractions, leaking of fluid and fetal movement were reviewed in detail with the patient. Please refer to  After Visit Summary for other counseling recommendations.  No follow-ups on file.  No future appointments.  Catalina Antigua, MD

## 2018-06-15 NOTE — Progress Notes (Signed)
Pt presents for ROB and 2 gtt labs. Pt c/o fatigue. Tdap and flu given today without difficulty.

## 2018-06-16 LAB — CBC
Hematocrit: 36.9 % (ref 34.0–46.6)
Hemoglobin: 12.6 g/dL (ref 11.1–15.9)
MCH: 32.5 pg (ref 26.6–33.0)
MCHC: 34.1 g/dL (ref 31.5–35.7)
MCV: 95 fL (ref 79–97)
PLATELETS: 261 10*3/uL (ref 150–450)
RBC: 3.88 x10E6/uL (ref 3.77–5.28)
RDW: 12.3 % (ref 11.7–15.4)
WBC: 8.6 10*3/uL (ref 3.4–10.8)

## 2018-06-16 LAB — HIV ANTIBODY (ROUTINE TESTING W REFLEX): HIV Screen 4th Generation wRfx: NONREACTIVE

## 2018-06-16 LAB — RPR: RPR: NONREACTIVE

## 2018-06-16 LAB — GLUCOSE TOLERANCE, 2 HOURS W/ 1HR
GLUCOSE, FASTING: 80 mg/dL (ref 65–91)
Glucose, 1 hour: 127 mg/dL (ref 65–179)
Glucose, 2 hour: 76 mg/dL (ref 65–152)

## 2018-06-29 ENCOUNTER — Ambulatory Visit (INDEPENDENT_AMBULATORY_CARE_PROVIDER_SITE_OTHER): Payer: Medicaid Other | Admitting: Advanced Practice Midwife

## 2018-06-29 ENCOUNTER — Other Ambulatory Visit: Payer: Self-pay

## 2018-06-29 VITALS — BP 116/74 | HR 88 | Wt 166.5 lb

## 2018-06-29 DIAGNOSIS — Z3480 Encounter for supervision of other normal pregnancy, unspecified trimester: Secondary | ICD-10-CM

## 2018-06-29 DIAGNOSIS — W109XXA Fall (on) (from) unspecified stairs and steps, initial encounter: Secondary | ICD-10-CM

## 2018-06-29 DIAGNOSIS — Z789 Other specified health status: Secondary | ICD-10-CM

## 2018-06-29 DIAGNOSIS — S300XXA Contusion of lower back and pelvis, initial encounter: Secondary | ICD-10-CM

## 2018-06-29 DIAGNOSIS — O9A213 Injury, poisoning and certain other consequences of external causes complicating pregnancy, third trimester: Secondary | ICD-10-CM

## 2018-06-29 DIAGNOSIS — S3982XA Other specified injuries of lower back, initial encounter: Secondary | ICD-10-CM

## 2018-06-29 DIAGNOSIS — Z3A30 30 weeks gestation of pregnancy: Secondary | ICD-10-CM

## 2018-06-29 NOTE — Progress Notes (Signed)
   PRENATAL VISIT NOTE  Subjective:  Deborah Cardenas is a 33 y.o. G2P1001 at [redacted]w[redacted]d being seen today for ongoing prenatal care.  She is currently monitored for the following issues for this low-risk pregnancy and has Supervision of other normal pregnancy, antepartum and Language barrier on their problem list.  Patient reports pain in her tailbone following a fall 4-5 days ago on her stairs..  Contractions: Not present. Vag. Bleeding: None.  Movement: Present. Denies leaking of fluid.   The following portions of the patient's history were reviewed and updated as appropriate: allergies, current medications, past family history, past medical history, past social history, past surgical history and problem list.   Objective:   Vitals:   06/29/18 0822  BP: 116/74  Pulse: 88  Weight: 75.5 kg    Fetal Status: Fetal Heart Rate (bpm): 152 Fundal Height: 30 cm Movement: Present     General:  Alert, oriented and cooperative. Patient is in no acute distress.  Skin: Skin is warm and dry. No rash noted.   Cardiovascular: Normal heart rate noted  Respiratory: Normal respiratory effort, no problems with respiration noted  Abdomen: Soft, gravid, appropriate for gestational age.  Pain/Pressure: Absent     Pelvic: Cervical exam deferred        Extremities: Normal range of motion.  Edema: None  Mental Status: Normal mood and affect. Normal behavior. Normal judgment and thought content.   Assessment and Plan:  Pregnancy: G2P1001 at [redacted]w[redacted]d  1. Language barrier --[place] language, video interpreter used for all communication.  2. Supervision of other normal pregnancy, antepartum --Anticipatory guidance about next visits/weeks of pregnancy given. --Reviewed normal 28 week lab results  3. Coccyx contusion, initial encounter --Pain with walking, sitting.  Exam with tenderness only at coccyx, no abnormalities palpated.  Likely contusion.   --Ice/rest/Tylenol  Preterm labor symptoms and general  obstetric precautions including but not limited to vaginal bleeding, contractions, leaking of fluid and fetal movement were reviewed in detail with the patient. Please refer to After Visit Summary for other counseling recommendations.   Return in about 2 weeks (around 07/13/2018).  Future Appointments  Date Time Provider Department Center  07/13/2018  8:30 AM [place], Wilmer Floor, CNM [facility] None    Sharen Counter, CNM

## 2018-06-29 NOTE — Patient Instructions (Signed)
Third Trimester of Pregnancy The third trimester is from week 28 through week 40 (months 7 through 9). The third trimester is a time when the unborn baby (fetus) is growing rapidly. At the end of the ninth month, the fetus is about 20 inches in length and weighs 6-10 pounds. Body changes during your third trimester Your body will continue to go through many changes during pregnancy. The changes vary from woman to woman. During the third trimester:  Your weight will continue to increase. You can expect to gain 25-35 pounds (11-16 kg) by the end of the pregnancy.  You may begin to get stretch marks on your hips, abdomen, and breasts.  You may urinate more often because the fetus is moving lower into your pelvis and pressing on your bladder.  You may develop or continue to have heartburn. This is caused by increased hormones that slow down muscles in the digestive tract.  You may develop or continue to have constipation because increased hormones slow digestion and cause the muscles that push waste through your intestines to relax.  You may develop hemorrhoids. These are swollen veins (varicose veins) in the rectum that can itch or be painful.  You may develop swollen, bulging veins (varicose veins) in your legs.  You may have increased body aches in the pelvis, back, or thighs. This is due to weight gain and increased hormones that are relaxing your joints.  You may have changes in your hair. These can include thickening of your hair, rapid growth, and changes in texture. Some women also have hair loss during or after pregnancy, or hair that feels dry or thin. Your hair will most likely return to normal after your baby is born.  Your breasts will continue to grow and they will continue to become tender. A yellow fluid (colostrum) may leak from your breasts. This is the first milk you are producing for your baby.  Your belly button may stick out.  You may notice more swelling in your hands,  face, or ankles.  You may have increased tingling or numbness in your hands, arms, and legs. The skin on your belly may also feel numb.  You may feel short of breath because of your expanding uterus.  You may have more problems sleeping. This can be caused by the size of your belly, increased need to urinate, and an increase in your body's metabolism.  You may notice the fetus "dropping," or moving lower in your abdomen (lightening).  You may have increased vaginal discharge.  You may notice your joints feel loose and you may have pain around your pelvic bone. What to expect at prenatal visits You will have prenatal exams every 2 weeks until week 36. Then you will have weekly prenatal exams. During a routine prenatal visit:  You will be weighed to make sure you and the baby are growing normally.  Your blood pressure will be taken.  Your abdomen will be measured to track your baby's growth.  The fetal heartbeat will be listened to.  Any test results from the previous visit will be discussed.  You may have a cervical check near your due date to see if your cervix has softened or thinned (effaced).  You will be tested for Group B streptococcus. This happens between 35 and 37 weeks. Your health care provider may ask you:  What your birth plan is.  How you are feeling.  If you are feeling the baby move.  If you have had any abnormal   symptoms, such as leaking fluid, bleeding, severe headaches, or abdominal cramping.  If you are using any tobacco products, including cigarettes, chewing tobacco, and electronic cigarettes.  If you have any questions. Other tests or screenings that may be performed during your third trimester include:  Blood tests that check for low iron levels (anemia).  Fetal testing to check the health, activity level, and growth of the fetus. Testing is done if you have certain medical conditions or if there are problems during the pregnancy.  Nonstress test  (NST). This test checks the health of your baby to make sure there are no signs of problems, such as the baby not getting enough oxygen. During this test, a belt is placed around your belly. The baby is made to move, and its heart rate is monitored during movement. What is false labor? False labor is a condition in which you feel small, irregular tightenings of the muscles in the womb (contractions) that usually go away with rest, changing position, or drinking water. These are called Braxton Hicks contractions. Contractions may last for hours, days, or even weeks before true labor sets in. If contractions come at regular intervals, become more frequent, increase in intensity, or become painful, you should see your health care provider. What are the signs of labor?  Abdominal cramps.  Regular contractions that start at 10 minutes apart and become stronger and more frequent with time.  Contractions that start on the top of the uterus and spread down to the lower abdomen and back.  Increased pelvic pressure and dull back pain.  A watery or bloody mucus discharge that comes from the vagina.  Leaking of amniotic fluid. This is also known as your "water breaking." It could be a slow trickle or a gush. Let your health care provider know if it has a color or strange odor. If you have any of these signs, call your health care provider right away, even if it is before your due date. Follow these instructions at home: Medicines  Follow your health care provider's instructions regarding medicine use. Specific medicines may be either safe or unsafe to take during pregnancy.  Take a prenatal vitamin that contains at least 600 micrograms (mcg) of folic acid.  If you develop constipation, try taking a stool softener if your health care provider approves. Eating and drinking   Eat a balanced diet that includes fresh fruits and vegetables, whole grains, good sources of protein such as meat, eggs, or tofu,  and low-fat dairy. Your health care provider will help you determine the amount of weight gain that is right for you.  Avoid raw meat and uncooked cheese. These carry germs that can cause birth defects in the baby.  If you have low calcium intake from food, talk to your health care provider about whether you should take a daily calcium supplement.  Eat four or five small meals rather than three large meals a day.  Limit foods that are high in fat and processed sugars, such as fried and sweet foods.  To prevent constipation: ? Drink enough fluid to keep your urine clear or pale yellow. ? Eat foods that are high in fiber, such as fresh fruits and vegetables, whole grains, and beans. Activity  Exercise only as directed by your health care provider. Most women can continue their usual exercise routine during pregnancy. Try to exercise for 30 minutes at least 5 days a week. Stop exercising if you experience uterine contractions.  Avoid heavy lifting.  Do   not exercise in extreme heat or humidity, or at high altitudes.  Wear low-heel, comfortable shoes.  Practice good posture.  You may continue to have sex unless your health care provider tells you otherwise. Relieving pain and discomfort  Take frequent breaks and rest with your legs elevated if you have leg cramps or low back pain.  Take warm sitz baths to soothe any pain or discomfort caused by hemorrhoids. Use hemorrhoid cream if your health care provider approves.  Wear a good support bra to prevent discomfort from breast tenderness.  If you develop varicose veins: ? Wear support pantyhose or compression stockings as told by your healthcare provider. ? Elevate your feet for 15 minutes, 3-4 times a day. Prenatal care  Write down your questions. Take them to your prenatal visits.  Keep all your prenatal visits as told by your health care provider. This is important. Safety  Wear your seat belt at all times when driving.  Make  a list of emergency phone numbers, including numbers for family, friends, the hospital, and police and fire departments. General instructions  Avoid cat litter boxes and soil used by cats. These carry germs that can cause birth defects in the baby. If you have a cat, ask someone to clean the litter box for you.  Do not travel far distances unless it is absolutely necessary and only with the approval of your health care provider.  Do not use hot tubs, steam rooms, or saunas.  Do not drink alcohol.  Do not use any products that contain nicotine or tobacco, such as cigarettes and e-cigarettes. If you need help quitting, ask your health care provider.  Do not use any medicinal herbs or unprescribed drugs. These chemicals affect the formation and growth of the baby.  Do not douche or use tampons or scented sanitary pads.  Do not cross your legs for long periods of time.  To prepare for the arrival of your baby: ? Take prenatal classes to understand, practice, and ask questions about labor and delivery. ? Make a trial run to the hospital. ? Visit the hospital and tour the maternity area. ? Arrange for maternity or paternity leave through employers. ? Arrange for family and friends to take care of pets while you are in the hospital. ? Purchase a rear-facing car seat and make sure you know how to install it in your car. ? Pack your hospital bag. ? Prepare the baby's nursery. Make sure to remove all pillows and stuffed animals from the baby's crib to prevent suffocation.  Visit your dentist if you have not gone during your pregnancy. Use a soft toothbrush to brush your teeth and be gentle when you floss. Contact a health care provider if:  You are unsure if you are in labor or if your water has broken.  You become dizzy.  You have mild pelvic cramps, pelvic pressure, or nagging pain in your abdominal area.  You have lower back pain.  You have persistent nausea, vomiting, or  diarrhea.  You have an unusual or bad smelling vaginal discharge.  You have pain when you urinate. Get help right away if:  Your water breaks before 37 weeks.  You have regular contractions less than 5 minutes apart before 37 weeks.  You have a fever.  You are leaking fluid from your vagina.  You have spotting or bleeding from your vagina.  You have severe abdominal pain or cramping.  You have rapid weight loss or weight gain.  You have   shortness of breath with chest pain.  You notice sudden or extreme swelling of your face, hands, ankles, feet, or legs.  Your baby makes fewer than 10 movements in 2 hours.  You have severe headaches that do not go away when you take medicine.  You have vision changes. Summary  The third trimester is from week 28 through week 40, months 7 through 9. The third trimester is a time when the unborn baby (fetus) is growing rapidly.  During the third trimester, your discomfort may increase as you and your baby continue to gain weight. You may have abdominal, leg, and back pain, sleeping problems, and an increased need to urinate.  During the third trimester your breasts will keep growing and they will continue to become tender. A yellow fluid (colostrum) may leak from your breasts. This is the first milk you are producing for your baby.  False labor is a condition in which you feel small, irregular tightenings of the muscles in the womb (contractions) that eventually go away. These are called Braxton Hicks contractions. Contractions may last for hours, days, or even weeks before true labor sets in.  Signs of labor can include: abdominal cramps; regular contractions that start at 10 minutes apart and become stronger and more frequent with time; watery or bloody mucus discharge that comes from the vagina; increased pelvic pressure and dull back pain; and leaking of amniotic fluid. This information is not intended to replace advice given to you by your  health care provider. Make sure you discuss any questions you have with your health care provider. Document Released: 04/02/2001 Document Revised: 05/14/2016 Document Reviewed: 05/14/2016 Elsevier Interactive Patient Education  2019 [employer] Inc[employer]  

## 2018-07-13 ENCOUNTER — Ambulatory Visit (INDEPENDENT_AMBULATORY_CARE_PROVIDER_SITE_OTHER): Payer: Medicaid Other | Admitting: Medical

## 2018-07-13 ENCOUNTER — Encounter: Payer: Self-pay | Admitting: Medical

## 2018-07-13 ENCOUNTER — Other Ambulatory Visit: Payer: Self-pay

## 2018-07-13 VITALS — BP 105/70 | HR 83 | Wt 167.6 lb

## 2018-07-13 DIAGNOSIS — Z3A32 32 weeks gestation of pregnancy: Secondary | ICD-10-CM

## 2018-07-13 DIAGNOSIS — Z3480 Encounter for supervision of other normal pregnancy, unspecified trimester: Secondary | ICD-10-CM

## 2018-07-13 DIAGNOSIS — Z789 Other specified health status: Secondary | ICD-10-CM

## 2018-07-13 DIAGNOSIS — Z3483 Encounter for supervision of other normal pregnancy, third trimester: Secondary | ICD-10-CM

## 2018-07-13 NOTE — Progress Notes (Signed)
   PRENATAL VISIT NOTE  Subjective:  Deborah Cardenas is a 33 y.o. G2P1001 at [redacted]w[redacted]d being seen today for ongoing prenatal care.  She is currently monitored for the following issues for this low-risk pregnancy and has Supervision of other normal pregnancy, antepartum and Language barrier on their problem list.  Patient reports no complaints.  Contractions: Not present. Vag. Bleeding: None.  Movement: Present. Denies leaking of fluid.   The following portions of the patient's history were reviewed and updated as appropriate: allergies, current medications, past family history, past medical history, past social history, past surgical history and problem list.   Objective:   Vitals:   07/13/18 0828  BP: 105/70  Pulse: 83  Weight: 167 lb 9.6 oz (76 kg)    Fetal Status: Fetal Heart Rate (bpm): 164   Movement: Present     General:  Alert, oriented and cooperative. Patient is in no acute distress.  Skin: Skin is warm and dry. No rash noted.   Cardiovascular: Normal heart rate noted  Respiratory: Normal respiratory effort, no problems with respiration noted  Abdomen: Soft, gravid, appropriate for gestational age.  Pain/Pressure: Absent     Pelvic: Cervical exam deferred        Extremities: Normal range of motion.  Edema: None  Mental Status: Normal mood and affect. Normal behavior. Normal judgment and thought content.   Assessment and Plan:  Pregnancy: G2P1001 at [redacted]w[redacted]d 1. Supervision of other normal pregnancy, antepartum - Encouraged patient to call ahead if experiencing respiratory symptoms with fever so that she can be triaged and tested/directed appropriately   2. Language barrier - Speaks [place], but understand English well  Preterm labor symptoms and general obstetric precautions including but not limited to vaginal bleeding, contractions, leaking of fluid and fetal movement were reviewed in detail with the patient. Please refer to After Visit Summary for other counseling  recommendations.   Return in about 4 weeks (around 08/10/2018).  No future appointments.  Vonzella Nipple, PA-C

## 2018-07-13 NOTE — Patient Instructions (Signed)

## 2018-07-13 NOTE — Progress Notes (Signed)
[place] Interpreter [place] [phone]  ROB.  Reports no complaints

## 2018-08-10 ENCOUNTER — Encounter: Payer: Self-pay | Admitting: Obstetrics and Gynecology

## 2018-08-10 ENCOUNTER — Other Ambulatory Visit: Payer: Self-pay

## 2018-08-10 ENCOUNTER — Other Ambulatory Visit (HOSPITAL_COMMUNITY)
Admission: RE | Admit: 2018-08-10 | Discharge: 2018-08-10 | Disposition: A | Discharge status: Home / Self Care | Payer: Medicaid Other | Source: Ambulatory Visit | Attending: Obstetrics and Gynecology | Admitting: Obstetrics and Gynecology

## 2018-08-10 ENCOUNTER — Ambulatory Visit (INDEPENDENT_AMBULATORY_CARE_PROVIDER_SITE_OTHER): Payer: Medicaid Other | Admitting: Obstetrics and Gynecology

## 2018-08-10 VITALS — BP 112/69 | HR 72 | Wt 174.3 lb

## 2018-08-10 DIAGNOSIS — Z3483 Encounter for supervision of other normal pregnancy, third trimester: Secondary | ICD-10-CM

## 2018-08-10 DIAGNOSIS — Z3480 Encounter for supervision of other normal pregnancy, unspecified trimester: Secondary | ICD-10-CM | POA: Diagnosis present

## 2018-08-10 DIAGNOSIS — Z789 Other specified health status: Secondary | ICD-10-CM

## 2018-08-10 DIAGNOSIS — Z3A36 36 weeks gestation of pregnancy: Secondary | ICD-10-CM

## 2018-08-10 MED ORDER — BLOOD PRESSURE MONITOR/M CUFF MISC: 1.0000 | 0 refills | Status: DC

## 2018-08-10 NOTE — Progress Notes (Signed)
   PRENATAL VISIT NOTE  Subjective:  Deborah Cardenas is a 33 y.o. G2P1001 at [redacted]w[redacted]d being seen today for ongoing prenatal care.  She is currently monitored for the following issues for this low-risk pregnancy and has Supervision of other normal pregnancy, antepartum and Language barrier on their problem list.  Patient reports no complaints.   .  .  Movement: Present. Denies leaking of fluid.   The following portions of the patient's history were reviewed and updated as appropriate: allergies, current medications, past family history, past medical history, past social history, past surgical history and problem list.   Objective:   Vitals:   08/10/18 0814  BP: 112/69  Pulse: 72  Weight: 174 lb 4.8 oz (79.1 kg)    Fetal Status: Fetal Heart Rate (bpm): 147 Fundal Height: 36 cm Movement: Present  Presentation: Vertex  General:  Alert, oriented and cooperative. Patient is in no acute distress.  Skin: Skin is warm and dry. No rash noted.   Cardiovascular: Normal heart rate noted  Respiratory: Normal respiratory effort, no problems with respiration noted  Abdomen: Soft, gravid, appropriate for gestational age.  Pain/Pressure: Absent     Pelvic: Cervical exam performed Dilation: 1 Effacement (%): 50 Station: -3  Extremities: Normal range of motion.  Edema: None  Mental Status: Normal mood and affect. Normal behavior. Normal judgment and thought content.   Assessment and Plan:  Pregnancy: G2P1001 at [redacted]w[redacted]d 1. Supervision of other normal pregnancy, antepartum Patient is doing well without complaints Rx BP cuff provided Cultures collected Patient understands that her next visit will be a telehealth visit  2. Language barrier [place] used  Preterm labor symptoms and general obstetric precautions including but not limited to vaginal bleeding, contractions, leaking of fluid and fetal movement were reviewed in detail with the patient. Please refer to After Visit Summary for other counseling  recommendations.   Return in about 1 week (around 08/17/2018) for TELEHEALTH: ROB.  No future appointments.  Catalina Antigua, MD

## 2018-08-11 LAB — CERVICOVAGINAL ANCILLARY ONLY
Chlamydia: NEGATIVE
Neisseria Gonorrhea: NEGATIVE

## 2018-08-13 ENCOUNTER — Encounter: Payer: Self-pay | Admitting: Obstetrics and Gynecology

## 2018-08-13 DIAGNOSIS — O99820 Streptococcus B carrier state complicating pregnancy: Secondary | ICD-10-CM | POA: Insufficient documentation

## 2018-08-13 LAB — CULTURE, BETA STREP (GROUP B ONLY): Strep Gp B Culture: POSITIVE — AB

## 2018-08-17 ENCOUNTER — Encounter: Payer: Self-pay | Admitting: *Deleted

## 2018-08-17 ENCOUNTER — Encounter: Payer: Self-pay | Admitting: Obstetrics & Gynecology

## 2018-08-17 ENCOUNTER — Other Ambulatory Visit: Payer: Self-pay

## 2018-08-17 ENCOUNTER — Ambulatory Visit (INDEPENDENT_AMBULATORY_CARE_PROVIDER_SITE_OTHER): Payer: Medicaid Other | Admitting: Obstetrics & Gynecology

## 2018-08-17 VITALS — BP 101/86 | HR 86

## 2018-08-17 DIAGNOSIS — O99820 Streptococcus B carrier state complicating pregnancy: Secondary | ICD-10-CM

## 2018-08-17 DIAGNOSIS — Z3A37 37 weeks gestation of pregnancy: Secondary | ICD-10-CM | POA: Diagnosis not present

## 2018-08-17 DIAGNOSIS — Z3483 Encounter for supervision of other normal pregnancy, third trimester: Secondary | ICD-10-CM

## 2018-08-17 DIAGNOSIS — Z3480 Encounter for supervision of other normal pregnancy, unspecified trimester: Secondary | ICD-10-CM

## 2018-08-17 NOTE — Patient Instructions (Signed)
Return to office for any scheduled appointments. Call the office or go to the MAU at [facility] if:  You begin to have strong, frequent contractions  Your water breaks.  Sometimes it is a big gush of fluid, sometimes it is just a trickle that keeps getting your panties wet or running down your legs  You have vaginal bleeding.  It is normal to have a small amount of spotting if your cervix was checked.   You do not feel your baby moving like normal.  If you do not, get something to eat and drink and lay down and focus on feeling your baby move.   If your baby is still not moving like normal, you should call the office or go to MAU.  Any other obstetric concerns.   

## 2018-08-17 NOTE — Progress Notes (Signed)
   TELEHEALTH VIRTUAL OBSTETRICS VISIT ENCOUNTER NOTE  I connected with Deborah Cardenas on 08/17/18 at  3:00 PM EDT by telephone at home and verified that I am speaking with the correct person using two identifiers. Due to language barrier, a [place] interpreter was present during the phone encounter with this patient. Unable to do WebEx due to need for interpreter, unable to link interpreter to WebEx.   I discussed the limitations, risks, security and privacy concerns of performing an evaluation and management service by telephone and the availability of in person appointments. I also discussed with the patient that there may be a patient responsible charge related to this service. The patient expressed understanding and agreed to proceed.    Subjective:  Deborah Cardenas is a 33 y.o. G2P1001 at [redacted]w[redacted]d being followed for ongoing prenatal care.  She is currently monitored for the following issues for this low-risk pregnancy and has Supervision of other normal pregnancy, antepartum; Language barrier; and GBS (group B Streptococcus carrier), +RV culture, currently pregnant on their problem list.  Patient reports no complaints. Reports fetal movement. Denies any contractions, bleeding or leaking of fluid.   The following portions of the patient's history were reviewed and updated as appropriate: allergies, current medications, past family history, past medical history, past social history, past surgical history and problem list.   Objective:   General:  Alert, oriented and cooperative.   Mental Status: Normal mood and affect perceived. Normal judgment and thought content.  Rest of physical exam deferred due to type of encounter  Results for orders placed or performed in visit on 08/10/18 (from the past 336 hour(s))  Cervicovaginal ancillary only( [place])   Collection Time: 08/10/18 12:00 AM  Result Value Ref Range   Chlamydia Negative    Neisseria gonorrhea Negative   Culture, beta strep  (group b only)   Collection Time: 08/10/18  8:42 AM  Result Value Ref Range   Strep Gp B Culture Positive (A) Negative    Assessment and Plan:  Pregnancy: G2P1001 at [redacted]w[redacted]d 1. GBS (group B Streptococcus carrier), +RV culture, currently pregnant Patient informed of results, and need for labor prophylaxis.  2. Supervision of other normal pregnancy, antepartum Term labor symptoms and general obstetric precautions including but not limited to vaginal bleeding, contractions, leaking of fluid and fetal movement were reviewed in detail with the patient.  I discussed the assessment and treatment plan with the patient. The patient was provided an opportunity to ask questions and all were answered. The patient agreed with the plan and demonstrated an understanding of the instructions. The patient was advised to call back or seek an in-person office evaluation/go to MAU at [facility] for any urgent or concerning symptoms. Please refer to After Visit Summary for other counseling recommendations.   I provided 10 minutes of non-face-to-face time during this encounter.  Return in about 1 week (around 08/24/2018) for Virtual OB Visit (with Dr. Jolayne Panther) .  No future appointments.  Jaynie Collins, MD [facility] for [employer], [facility]

## 2018-08-17 NOTE — Progress Notes (Signed)
Televisit ROB: No complaints per pt. Used [place] [phone]

## 2018-08-25 DIAGNOSIS — Z3483 Encounter for supervision of other normal pregnancy, third trimester: Secondary | ICD-10-CM

## 2018-08-27 ENCOUNTER — Telehealth: Payer: Self-pay

## 2018-08-27 NOTE — Telephone Encounter (Signed)
S/w pt and advised that FMLA for husband is complete

## 2018-09-04 ENCOUNTER — Other Ambulatory Visit: Payer: Self-pay

## 2018-09-04 ENCOUNTER — Encounter: Payer: Self-pay | Admitting: Obstetrics and Gynecology

## 2018-09-04 ENCOUNTER — Ambulatory Visit (INDEPENDENT_AMBULATORY_CARE_PROVIDER_SITE_OTHER): Payer: Medicaid Other | Admitting: Obstetrics and Gynecology

## 2018-09-04 VITALS — BP 125/81 | HR 96 | Wt 180.3 lb

## 2018-09-04 DIAGNOSIS — Z3A39 39 weeks gestation of pregnancy: Secondary | ICD-10-CM

## 2018-09-04 DIAGNOSIS — Z789 Other specified health status: Secondary | ICD-10-CM

## 2018-09-04 DIAGNOSIS — O99820 Streptococcus B carrier state complicating pregnancy: Secondary | ICD-10-CM

## 2018-09-04 DIAGNOSIS — Z758 Other problems related to medical facilities and other health care: Secondary | ICD-10-CM

## 2018-09-04 DIAGNOSIS — Z3480 Encounter for supervision of other normal pregnancy, unspecified trimester: Secondary | ICD-10-CM

## 2018-09-04 NOTE — Progress Notes (Signed)
ROB.  C/o cramping in hands and legs x 1 week and pelvic pain.  Having problems breathing, denies NV, diarrhea, body pains,coughing.

## 2018-09-04 NOTE — Progress Notes (Signed)
   PRENATAL VISIT NOTE  Subjective:  Deborah Cardenas is a 33 y.o. G2P1001 at [redacted]w[redacted]d being seen today for ongoing prenatal care.  She is currently monitored for the following issues for this low-risk pregnancy and has Supervision of other normal pregnancy, antepartum; Language barrier; and GBS (group B Streptococcus carrier), +RV culture, currently pregnant on their problem list.  Patient reports pelvic pressure and 3rd trimester discomfort. She is ready for delivery.  Contractions: Irritability. Vag. Bleeding: None.  Movement: Present. Denies leaking of fluid.   The following portions of the patient's history were reviewed and updated as appropriate: allergies, current medications, past family history, past medical history, past social history, past surgical history and problem list.   Objective:   Vitals:   09/04/18 1028  BP: 125/81  Pulse: 96  Weight: 180 lb 4.8 oz (81.8 kg)    Fetal Status: Fetal Heart Rate (bpm): 158 Fundal Height: 38 cm Movement: Present  Presentation: Vertex  General:  Alert, oriented and cooperative. Patient is in no acute distress.  Skin: Skin is warm and dry. No rash noted.   Cardiovascular: Normal heart rate noted  Respiratory: Normal respiratory effort, no problems with respiration noted  Abdomen: Soft, gravid, appropriate for gestational age.  Pain/Pressure: Present     Pelvic: Cervical exam performed Dilation: 2 Effacement (%): 50 Station: -3  Extremities: Normal range of motion.  Edema: None  Mental Status: Normal mood and affect. Normal behavior. Normal judgment and thought content.   Assessment and Plan:  Pregnancy: G2P1001 at [redacted]w[redacted]d 1. Supervision of other normal pregnancy, antepartum Patient is doing well  IOL scheduled at 41 weeks  Postdate testing next week  2. GBS (group B Streptococcus carrier), +RV culture, currently pregnant Prophylaxis in labor  3. Language barrier   Preterm labor symptoms and general obstetric precautions including  but not limited to vaginal bleeding, contractions, leaking of fluid and fetal movement were reviewed in detail with the patient. Please refer to After Visit Summary for other counseling recommendations.   Return in about 5 days (around 09/09/2018) for IN PERSON: , ROB, NST.  Future Appointments  Date Time Provider Department Center  09/13/2018  8:00 AM [facility] SCHED ROOM [facility] None    Catalina Antigua, MD

## 2018-09-06 ENCOUNTER — Inpatient Hospital Stay (HOSPITAL_COMMUNITY): Payer: Medicaid Other | Admitting: Anesthesiology

## 2018-09-06 ENCOUNTER — Encounter (HOSPITAL_COMMUNITY): Payer: Self-pay

## 2018-09-06 ENCOUNTER — Inpatient Hospital Stay (HOSPITAL_COMMUNITY)
Admission: AD | Admit: 2018-09-06 | Discharge: 2018-09-08 | DRG: 807 | Disposition: A | Discharge status: Home / Self Care | Payer: Medicaid Other | Attending: Obstetrics and Gynecology | Admitting: Obstetrics and Gynecology

## 2018-09-06 ENCOUNTER — Other Ambulatory Visit: Payer: Self-pay

## 2018-09-06 DIAGNOSIS — Z789 Other specified health status: Secondary | ICD-10-CM | POA: Diagnosis present

## 2018-09-06 DIAGNOSIS — Z975 Presence of (intrauterine) contraceptive device: Secondary | ICD-10-CM

## 2018-09-06 DIAGNOSIS — Z30430 Encounter for insertion of intrauterine contraceptive device: Secondary | ICD-10-CM

## 2018-09-06 DIAGNOSIS — Z1159 Encounter for screening for other viral diseases: Secondary | ICD-10-CM | POA: Diagnosis not present

## 2018-09-06 DIAGNOSIS — O99820 Streptococcus B carrier state complicating pregnancy: Secondary | ICD-10-CM

## 2018-09-06 DIAGNOSIS — O99824 Streptococcus B carrier state complicating childbirth: Secondary | ICD-10-CM | POA: Diagnosis present

## 2018-09-06 DIAGNOSIS — O26893 Other specified pregnancy related conditions, third trimester: Secondary | ICD-10-CM | POA: Diagnosis present

## 2018-09-06 DIAGNOSIS — Z3A40 40 weeks gestation of pregnancy: Secondary | ICD-10-CM

## 2018-09-06 DIAGNOSIS — Z3480 Encounter for supervision of other normal pregnancy, unspecified trimester: Secondary | ICD-10-CM

## 2018-09-06 DIAGNOSIS — Z603 Acculturation difficulty: Secondary | ICD-10-CM | POA: Diagnosis present

## 2018-09-06 LAB — URINALYSIS, ROUTINE W REFLEX MICROSCOPIC
Bilirubin Urine: NEGATIVE
Glucose, UA: NEGATIVE mg/dL
Ketones, ur: NEGATIVE mg/dL
Leukocytes,Ua: NEGATIVE
Nitrite: NEGATIVE
Protein, ur: NEGATIVE mg/dL
Specific Gravity, Urine: 1.008 (ref 1.005–1.030)
pH: 6 (ref 5.0–8.0)

## 2018-09-06 LAB — CBC
HCT: 38.5 % (ref 36.0–46.0)
Hemoglobin: 12.7 g/dL (ref 12.0–15.0)
MCH: 31.4 pg (ref 26.0–34.0)
MCHC: 33 g/dL (ref 30.0–36.0)
MCV: 95.1 fL (ref 80.0–100.0)
Platelets: 256 10*3/uL (ref 150–400)
RBC: 4.05 MIL/uL (ref 3.87–5.11)
RDW: 12.9 % (ref 11.5–15.5)
WBC: 9.2 10*3/uL (ref 4.0–10.5)
nRBC: 0 % (ref 0.0–0.2)

## 2018-09-06 LAB — ABO/RH: ABO/RH(D): O POS

## 2018-09-06 LAB — TYPE AND SCREEN
ABO/RH(D): O POS
Antibody Screen: NEGATIVE

## 2018-09-06 LAB — SARS CORONAVIRUS 2 BY RT PCR (HOSPITAL ORDER, PERFORMED IN ~~LOC~~ HOSPITAL LAB): SARS Coronavirus 2: NEGATIVE

## 2018-09-06 MED ORDER — OXYTOCIN BOLUS FROM INFUSION
500.0000 mL | Freq: Once | INTRAVENOUS | Status: AC
  Administered 2018-09-06: 500 mL via INTRAVENOUS

## 2018-09-06 MED ORDER — OXYTOCIN 40 UNITS IN NORMAL SALINE INFUSION - SIMPLE MED
1.0000 m[IU]/min | INTRAVENOUS | Status: DC
  Administered 2018-09-06: 19:00:00 2 m[IU]/min via INTRAVENOUS

## 2018-09-06 MED ORDER — ACETAMINOPHEN 325 MG PO TABS: 650.0000 mg | ORAL_TABLET | ORAL | Status: DC | PRN

## 2018-09-06 MED ORDER — FENTANYL CITRATE (PF) 100 MCG/2ML IJ SOLN
50.0000 ug | INTRAMUSCULAR | Status: DC | PRN
  Administered 2018-09-06: 100 ug via INTRAVENOUS
  Administered 2018-09-06: 50 ug via INTRAVENOUS
  Filled 2018-09-06 (×2): qty 2

## 2018-09-06 MED ORDER — OXYTOCIN 40 UNITS IN NORMAL SALINE INFUSION - SIMPLE MED
2.5000 [IU]/h | INTRAVENOUS | Status: DC
  Administered 2018-09-06: 2.5 [IU]/h via INTRAVENOUS
  Filled 2018-09-06: qty 1000

## 2018-09-06 MED ORDER — EPHEDRINE 5 MG/ML INJ: 10.0000 mg | INTRAVENOUS | Status: DC | PRN

## 2018-09-06 MED ORDER — SIMETHICONE 80 MG PO CHEW: 80.0000 mg | CHEWABLE_TABLET | Freq: Four times a day (QID) | ORAL | Status: DC | PRN

## 2018-09-06 MED ORDER — PENICILLIN G 3 MILLION UNITS IVPB - SIMPLE MED
3.0000 10*6.[IU] | INTRAVENOUS | Status: DC
  Administered 2018-09-06 (×3): 3 10*6.[IU] via INTRAVENOUS
  Filled 2018-09-06 (×3): qty 100

## 2018-09-06 MED ORDER — FENTANYL-BUPIVACAINE-NACL 0.5-0.125-0.9 MG/250ML-% EP SOLN: 12.0000 mL/h | EPIDURAL | Status: DC | PRN

## 2018-09-06 MED ORDER — SODIUM CHLORIDE (PF) 0.9 % IJ SOLN
INTRAMUSCULAR | Status: DC | PRN
  Administered 2018-09-06: 14 mL/h via EPIDURAL

## 2018-09-06 MED ORDER — SOD CITRATE-CITRIC ACID 500-334 MG/5ML PO SOLN: 30.0000 mL | ORAL | Status: DC | PRN

## 2018-09-06 MED ORDER — PHENYLEPHRINE 40 MCG/ML (10ML) SYRINGE FOR IV PUSH (FOR BLOOD PRESSURE SUPPORT)
80.0000 ug | PREFILLED_SYRINGE | INTRAVENOUS | Status: DC | PRN
  Filled 2018-09-06: qty 10

## 2018-09-06 MED ORDER — PARAGARD INTRAUTERINE COPPER IU IUD
INTRAUTERINE_SYSTEM | Freq: Once | INTRAUTERINE | Status: AC
  Administered 2018-09-06: 1 via INTRAUTERINE
  Filled 2018-09-06 (×2): qty 1

## 2018-09-06 MED ORDER — LACTATED RINGERS IV SOLN: 500.0000 mL | Freq: Once | INTRAVENOUS | Status: DC

## 2018-09-06 MED ORDER — ONDANSETRON HCL 4 MG/2ML IJ SOLN
4.0000 mg | Freq: Four times a day (QID) | INTRAMUSCULAR | Status: DC | PRN
  Administered 2018-09-06: 4 mg via INTRAVENOUS
  Filled 2018-09-06: qty 2

## 2018-09-06 MED ORDER — DIPHENHYDRAMINE HCL 50 MG/ML IJ SOLN: 12.5000 mg | INTRAMUSCULAR | Status: DC | PRN

## 2018-09-06 MED ORDER — SODIUM CHLORIDE 0.9 % IV SOLN
5.0000 10*6.[IU] | Freq: Once | INTRAVENOUS | Status: AC
  Administered 2018-09-06: 5 10*6.[IU] via INTRAVENOUS
  Filled 2018-09-06: qty 5

## 2018-09-06 MED ORDER — OXYCODONE-ACETAMINOPHEN 5-325 MG PO TABS: 2.0000 | ORAL_TABLET | ORAL | Status: DC | PRN

## 2018-09-06 MED ORDER — PHENYLEPHRINE 40 MCG/ML (10ML) SYRINGE FOR IV PUSH (FOR BLOOD PRESSURE SUPPORT): 80.0000 ug | PREFILLED_SYRINGE | INTRAVENOUS | Status: DC | PRN

## 2018-09-06 MED ORDER — LIDOCAINE HCL (PF) 1 % IJ SOLN: 30.0000 mL | INTRAMUSCULAR | Status: DC | PRN

## 2018-09-06 MED ORDER — PHENYLEPHRINE 40 MCG/ML (10ML) SYRINGE FOR IV PUSH (FOR BLOOD PRESSURE SUPPORT)
80.0000 ug | PREFILLED_SYRINGE | INTRAVENOUS | Status: DC | PRN
  Administered 2018-09-06: 80 ug via INTRAVENOUS

## 2018-09-06 MED ORDER — LACTATED RINGERS IV SOLN
500.0000 mL | INTRAVENOUS | Status: DC | PRN
  Administered 2018-09-06 (×2): 500 mL via INTRAVENOUS

## 2018-09-06 MED ORDER — LACTATED RINGERS IV SOLN
INTRAVENOUS | Status: DC
  Administered 2018-09-06 (×3): 125 mL/h via INTRAVENOUS

## 2018-09-06 MED ORDER — LIDOCAINE HCL (PF) 1 % IJ SOLN
INTRAMUSCULAR | Status: DC | PRN
  Administered 2018-09-06 (×2): 5 mL via EPIDURAL

## 2018-09-06 MED ORDER — FENTANYL-BUPIVACAINE-NACL 0.5-0.125-0.9 MG/250ML-% EP SOLN
12.0000 mL/h | EPIDURAL | Status: DC | PRN
  Filled 2018-09-06: qty 250

## 2018-09-06 MED ORDER — TERBUTALINE SULFATE 1 MG/ML IJ SOLN: 0.2500 mg | Freq: Once | INTRAMUSCULAR | Status: DC | PRN

## 2018-09-06 MED ORDER — LACTATED RINGERS IV SOLN
500.0000 mL | Freq: Once | INTRAVENOUS | Status: AC
  Administered 2018-09-06: 500 mL via INTRAVENOUS

## 2018-09-06 MED ORDER — FLEET ENEMA 7-19 GM/118ML RE ENEM: 1.0000 | ENEMA | RECTAL | Status: DC | PRN

## 2018-09-06 MED ORDER — OXYCODONE-ACETAMINOPHEN 5-325 MG PO TABS: 1.0000 | ORAL_TABLET | ORAL | Status: DC | PRN

## 2018-09-06 NOTE — Anesthesia Procedure Notes (Signed)
Epidural Patient location during procedure: OB Start time: 09/06/2018 5:47 PM End time: 09/06/2018 5:53 PM  Staffing Anesthesiologist: Shelton Silvas, MD Performed: anesthesiologist   Preanesthetic Checklist Completed: patient identified, site marked, surgical consent, pre-op evaluation, timeout performed, IV checked, risks and benefits discussed and monitors and equipment checked  Epidural Patient position: sitting Prep: ChloraPrep Patient monitoring: heart rate, continuous pulse ox and blood pressure Approach: midline Location: L3-L4 Injection technique: LOR saline  Needle:  Needle type: Tuohy  Needle gauge: 17 G Needle length: 9 cm Catheter type: closed end flexible Catheter size: 20 Guage Test dose: negative and 1.5% lidocaine  Assessment Events: blood not aspirated, injection not painful, no injection resistance and no paresthesia  Additional Notes LOR @ 6  Patient identified. Risks/Benefits/Options discussed with patient including but not limited to bleeding, infection, nerve damage, paralysis, failed block, incomplete pain control, headache, blood pressure changes, nausea, vomiting, reactions to medications, itching and postpartum back pain. Confirmed with bedside nurse the patient's most recent platelet count. Confirmed with patient that they are not currently taking any anticoagulation, have any bleeding history or any family history of bleeding disorders. Patient expressed understanding and wished to proceed. All questions were answered. Sterile technique was used throughout the entire procedure. Please see nursing notes for vital signs. Test dose was given through epidural catheter and negative prior to continuing to dose epidural or start infusion. Warning signs of high block given to the patient including shortness of breath, tingling/numbness in hands, complete motor block, or any concerning symptoms with instructions to call for help. Patient was given instructions on  fall risk and not to get out of bed. All questions and concerns addressed with instructions to call with any issues or inadequate analgesia.    Reason for block:procedure for pain

## 2018-09-06 NOTE — Anesthesia Preprocedure Evaluation (Signed)
Anesthesia Evaluation  Patient identified by MRN, date of birth, ID band Patient awake    Reviewed: Allergy & Precautions, Patient's Chart, lab work & pertinent test results  Airway Mallampati: II       Dental no notable dental hx.    Pulmonary neg pulmonary ROS,    Pulmonary exam normal        Cardiovascular Normal cardiovascular exam     Neuro/Psych negative neurological ROS     GI/Hepatic negative GI ROS, Neg liver ROS,   Endo/Other    Renal/GU negative Renal ROS     Musculoskeletal   Abdominal   Peds  Hematology   Anesthesia Other Findings   Reproductive/Obstetrics (+) Pregnancy                             Anesthesia Physical Anesthesia Plan  ASA: II  Anesthesia Plan: Epidural   Post-op Pain Management:    Induction:   PONV Risk Score and Plan:   Airway Management Planned:   Additional Equipment: None  Intra-op Plan:   Post-operative Plan:   Informed Consent: I have reviewed the patients History and Physical, chart, labs and discussed the procedure including the risks, benefits and alternatives for the proposed anesthesia with the patient or authorized representative who has indicated his/her understanding and acceptance.       Plan Discussed with:   Anesthesia Plan Comments: (Lab Results      Component                Value               Date                      WBC                      9.2                 09/06/2018                HGB                      12.7                09/06/2018                HCT                      38.5                09/06/2018                MCV                      95.1                09/06/2018                PLT                      256                 09/06/2018           )        Anesthesia Quick Evaluation

## 2018-09-06 NOTE — Discharge Summary (Signed)
Postpartum Discharge Summary     Patient Name: Deborah Cardenas DOB: 05/21/1985 MRN: [id number]  Date of admission: 09/06/2018 Delivering Provider: Sharyon CableOGERS, VERONICA C   Date of discharge: 09/08/2018  Admitting diagnosis: CTX Intrauterine pregnancy: 8372w0d     Secondary diagnosis:  Active Problems:   Supervision of other normal pregnancy, antepartum   Language barrier   GBS (group B Streptococcus carrier), +RV culture, currently pregnant   Labor and delivery indication for care or intervention   SVD (spontaneous vaginal delivery)   IUD (intrauterine device) in place  Additional problems: none     Discharge diagnosis: Term Pregnancy Delivered                                                                                                Post partum procedures:IUD placement   Augmentation: AROM and Pitocin  Complications: None  Hospital course:  Onset of Labor With Vaginal Delivery     33 y.o. yo G2P1001 at 1072w0d was admitted in Latent Labor on 09/06/2018. Patient had an uncomplicated labor course as follows:  Membrane Rupture Time/Date: 2:53 PM ,09/06/2018   Intrapartum Procedures: Episiotomy: None [1]                                         Lacerations:  None [1]  Patient had a delivery of a Viable infant. 09/06/2018  Information for the patient's newborn:  Bishop Cardenas, Girl Deborah [[id number]][[id number]]  Delivery Method: Vaginal, Spontaneous(Filed from Delivery Summary)    Pateint had an uncomplicated postpartum course. Elevated BP x 1 PP. Repeat BP Nml. Possible inacurrate reading. Pre-E precautions.  She is ambulating, tolerating a regular diet, passing flatus, and urinating well. Patient is discharged home in stable condition on 09/08/18.   Magnesium Sulfate recieved: No BMZ received: No  Physical exam  Vitals:   09/07/18 1351 09/07/18 2106 09/08/18 0512 09/08/18 0530  BP: 115/70 116/71 (!) 156/144 116/67  Pulse: 68 74 70   Resp: 19 18 18    Temp: 98.3 F (36.8 C) 98.8 F (37.1  C) 97.9 F (36.6 C)   TempSrc: Oral Oral Oral   SpO2:  98% 97%   Weight:      Height:       General: alert, cooperative and no distress Lochia: appropriate Uterine Fundus: firm Incision: N/A DVT Evaluation: No evidence of DVT seen on physical exam. Labs: Lab Results  Component Value Date   WBC 9.2 09/06/2018   HGB 12.7 09/06/2018   HCT 38.5 09/06/2018   MCV 95.1 09/06/2018   PLT 256 09/06/2018   No flowsheet data found.  Discharge instruction: per After Visit Summary and "Baby and Me Booklet".  After visit meds:  Allergies as of 09/08/2018   No Known Allergies     Medication List    TAKE these medications   Blood Pressure Monitor/M Cuff Misc 1 Device by Does not apply route once a week.   ibuprofen 600 MG tablet Commonly known as:  ADVIL Take 1 tablet (600 mg  total) by mouth every 6 (six) hours.   Vitafol Ultra 29-0.6-0.4-200 MG Caps Take 1 tablet by mouth daily.       Diet: routine diet  Activity: Advance as tolerated. Pelvic rest for 6 weeks.   Outpatient follow up:4 weeks Follow up Appt: Future Appointments  Date Time Provider Department Center  10/08/2018  9:00 AM Sharyon Cable, CNM [facility] None   Follow up Visit: Follow-up Information    [facility] Follow up in 4 week(s).   Why:  postpartum visit or sooner as needed if needed Contact information: [address] [place] [address] [phone]       [facility] Follow up.   Specialty:  Obstetrics and Gynecology Why:  as needed in pregnancy-related emergenices Contact information: [address] [id number] mc [place] [address] [phone]           Please schedule this patient for Postpartum visit in: 4 weeks with the following provider: Any provider For C/S patients schedule nurse incision check in weeks 2 weeks: no Low risk pregnancy complicated by: nothing Delivery mode:   SVD Anticipated Birth Control:  Postplacental IUD placed PP Procedures needed: String check   Schedule Integrated BH visit: no   Newborn Data: Live born female  Birth Weight:   APGAR: 8, 9  Newborn Delivery   Birth date/time:  09/06/2018 23:19:00 Delivery type:  Vaginal, Spontaneous     Baby Feeding: Breast Disposition:home with mother   09/08/2018 Deborah Cardenas, CNM

## 2018-09-06 NOTE — Progress Notes (Signed)
Patient ID: SUKHPREET FIECHTNER, female   DOB: 1985/07/13, 33 y.o.   MRN: [id number]  Labor Progress Note KYLIAH SCHENDEL is a 33 y.o. G2P1001 at [redacted]w[redacted]d presented for SOL  S: feeling painful contractions, increased frequency and intensity. Crying and not coping well. No pain between contractions and abdomen/uterus is soft between contractions. Reports "I cannot handle these contractions"  O:  BP 114/62   Pulse 89   Temp 98.4 F (36.9 C) (Oral)   Resp 20   Ht 5\' 2"  (1.575 m)   Wt 81.4 kg   LMP 12/14/2017 (Approximate)   SpO2 100%   BMI 32.83 kg/m  EFM: 120/mod/+accels, no decels  CVE: Dilation: 9 Effacement (%): 100 Cervical Position: Middle Station: 0 Presentation: Vertex Exam by:: Dr Alvester Morin   A&P: 33 y.o. G2P1001 [redacted]w[redacted]d with SOL #Labor: Protracted change- has not changed in > 3 hrs despite advanced dilation. Infant poorly applied to cervix between contractions.  Suspect asynclitic lie vs OP position. Patient does not tolerate cervical checks well currently. Plan to re-assess after epidural placement and will  place of IUPC at that time. Threshold for CS defined as 4 hours with out cervical change despite adequate MVUs. Mode of delivery: guarded, hopeful for vaginal delivery #Pain: Recommended epidural to help with labor coping/pain management. Explained with the use of intepreter #FWB: Cat 1 #GBS positive- PCN, 2nd dose hung. AROM@1453   [name], MD 5:32 PM

## 2018-09-06 NOTE — Progress Notes (Signed)
Patient ID: Deborah Cardenas, female   DOB: [birth year], 33 y.o.   MRN: [id number]  Labor Progress Note Deborah Cardenas is a 33 y.o. G2P1001 at [redacted]w[redacted]d presented for SOL  S: feeling painful contractions, increased frequency and intensity  O:  BP 129/64   Pulse 93   Temp 99.1 F (37.3 C) (Oral)   Resp 20   Ht 5\' 2"  (1.575 m)   Wt 81.4 kg   LMP 12/14/2017 (Approximate)   SpO2 99%   BMI 32.83 kg/m  EFM: 125/mod/+accels, no decels  CVE: Dilation: 9 Effacement (%): 100 Cervical Position: Middle Station: -1 Presentation: Vertex Exam by:: Dr Alvester Morin  AROM performed- meconium fluid  A&P: 33 y.o. G2P1001 [redacted]w[redacted]d with SOL #Labor: Progressing well. Expectant managment #Pain: Desired an additional dose of Fentanyl but counseled on possible delivery within 1 hr and feel that this is higher risk at this point. Reviewed option for epidural and patient declined and prefers to labor without pain medication at this point. Explained with the use of intepreter #FWB: Cat 1 #GBS positive- PCN, 2nd dose hung. AROM performed  [name], MD 3:10 PM

## 2018-09-06 NOTE — MAU Note (Signed)
Pt reports ctx q since 1900 yesterday.  Pt denies LOF, but reports some bloody show.  Pt reports decreased fetal movement since yesterday afternoon.

## 2018-09-06 NOTE — Procedures (Signed)
Post-Placental IUD Insertion Procedure Note  Patient identified, informed consent signed prior to delivery, signed copy in chart, time out was performed.    Vaginal, labial and perineal areas thoroughly inspected for lacerations. No laceration identified prior to placement of Paragard.   - IUD grasped with sterile ring forceps. Fundus identified through abdominal wall using non-insertion hand. IUD inserted to fundus with ring forceps. IUD carefully released at the fundus and ring forceps gently removed from vagina.   - IUD inserted with inserter per manufacturer's instructions.    Patient tolerated procedure well.   Lot # [id number] Expiration Date 07/2024  Patient given post procedure instructions and IUD care card with expiration date.  Patient is asked to keep IUD strings tucked in her vagina until her postpartum follow up visit in 4-6 weeks. Patient advised to abstain from sexual intercourse and pulling on strings before her follow-up visit. Patient verbalized an understanding of the plan of care and agrees.   [place] interpreter used during delivery and placement of IUD.  Sharyon Cable, CNM 09/06/18, 11:55 PM

## 2018-09-06 NOTE — Progress Notes (Signed)
Admission completed with french interpreter via [facility]

## 2018-09-06 NOTE — H&P (Signed)
OBSTETRIC ADMISSION HISTORY AND PHYSICAL  Deborah Cardenas is a 33 y.o. female G2P1001 with IUP at 957w0d by 21w US presenting for early labor with cervical changed from 2-4cm and variables present. She reports +FMs, No LOF, no VB, no blurry vision, headaches or peripheral edema, and RUQ pain.  She plans on breastfeeding. She request cu-IUD for birth control. She received her prenatal care at [facility]   Dating: By 21wk US --->  Estimated Date of Delivery: 09/06/18  Sono:    @21wk , CWD, normal anatomy   Prenatal History/Complications:  Past Medical History: History reviewed. No pertinent past medical history.  Past Surgical History: Past Surgical History:  Procedure Laterality Date  . NO PAST SURGERIES      Obstetrical History: OB History    Gravida  2   Para  1   Term  1   Preterm      AB      Living  1     SAB      TAB      Ectopic      Multiple      Live Births  1           Social History: Social History   Socioeconomic History  . Marital status: Married    Spouse name: Not on file  . Number of children: Not on file  . Years of education: Not on file  . Highest education level: Not on file  Occupational History  . Not on file  Social Needs  . Financial resource strain: Not on file  . Food insecurity:    Worry: Not on file    Inability: Not on file  . Transportation needs:    Medical: Not on file    Non-medical: Not on file  Tobacco Use  . Smoking status: Never Smoker  . Smokeless tobacco: Never Used  Substance and Sexual Activity  . Alcohol use: Never    Frequency: Never  . Drug use: Never  . Sexual activity: Not on file  Lifestyle  . Physical activity:    Days per week: Not on file    Minutes per session: Not on file  . Stress: Not on file  Relationships  . Social connections:    Talks on phone: Not on file    Gets together: Not on file    Attends religious service: Not on file    Active member of club or organization: Not on  file    Attends meetings of clubs or organizations: Not on file    Relationship status: Not on file  Other Topics Concern  . Not on file  Social History Narrative  . Not on file    Family History: History reviewed. No pertinent family history.  Allergies: No Known Allergies  Medications Prior to Admission  Medication Sig Dispense Refill Last Dose  . Blood Pressure Monitoring (BLOOD PRESSURE MONITOR/M CUFF) MISC 1 Device by Does not apply route once a week. 1 each 0 Taking  . Prenat-Fe Poly-Methfol-FA-DHA (VITAFOL ULTRA) 29-0.6-0.4-200 MG CAPS Take 1 tablet by mouth daily. 30 capsule 12 Taking     Review of Systems   All systems reviewed and negative except as stated in HPI  Blood pressure 109/89, pulse 94, temperature 99.6 F (37.6 C), temperature source Oral, resp. rate 20, height 5\' 2"  (1.575 m), weight 81.4 kg, last menstrual period 12/14/2017, SpO2 100 %. General appearance: alert, cooperative and appears stated age Lungs: clear to auscultation bilaterally Heart: regular rate and  rhythm Abdomen: soft, non-tender; bowel sounds normal Pelvic: adequate Extremities: Homans sign is negative, no sign of DVT DTR's wnl Presentation: cephalic Fetal monitoringBaseline: 120 bpm, Variability: Good {> 6 bpm), Accelerations: Reactive and Decelerations: Absent Uterine activityFrequency: Every 3-4 minutes Dilation: 4 Effacement (%): 90 Station: -1 Exam by:: n druebbisch rn   Prenatal labs: ABO, Rh: O/Positive/-- (11/12 0907) Antibody: Negative (11/12 0907) Rubella: 27.80 (11/12 0907) RPR: Non Reactive (02/24 0857)  HBsAg: Negative (11/12 0907)  HIV: Non Reactive (02/24 0857)  GBS:    1 hr Glucola wnl Genetic screening  NIPS low risk Anatomy US wnl  Prenatal Transfer Tool  Maternal Diabetes: No Genetic Screening: Normal Maternal Ultrasounds/Referrals: Normal Fetal Ultrasounds or other Referrals:  None Maternal Substance Abuse:  No Significant Maternal Medications:   None Significant Maternal Lab Results: Lab values include: Group B Strep positive  Results for orders placed or performed during the hospital encounter of 09/06/18 (from the past 24 hour(s))  Urinalysis, Routine w reflex microscopic   Collection Time: 09/06/18  8:58 AM  Result Value Ref Range   Color, Urine YELLOW YELLOW   APPearance CLEAR CLEAR   Specific Gravity, Urine 1.008 1.005 - 1.030   pH 6.0 5.0 - 8.0   Glucose, UA NEGATIVE NEGATIVE mg/dL   Hgb urine dipstick MODERATE (A) NEGATIVE   Bilirubin Urine NEGATIVE NEGATIVE   Ketones, ur NEGATIVE NEGATIVE mg/dL   Protein, ur NEGATIVE NEGATIVE mg/dL   Nitrite NEGATIVE NEGATIVE   Leukocytes,Ua NEGATIVE NEGATIVE   RBC / HPF 0-5 0 - 5 RBC/hpf   WBC, UA 0-5 0 - 5 WBC/hpf   Bacteria, UA MANY (A) NONE SEEN   Squamous Epithelial / LPF 6-10 0 - 5   Mucus PRESENT    Hyaline Casts, UA PRESENT     Patient Active Problem List   Diagnosis Date Noted  . Labor and delivery indication for care or intervention 09/06/2018  . GBS (group B Streptococcus carrier), +RV culture, currently pregnant 08/13/2018  . Language barrier 03/31/2018  . Supervision of other normal pregnancy, antepartum 03/03/2018    Assessment/Plan:  Deborah Cardenas is a 33 y.o. G2P1001 at [redacted]w[redacted]d here for early labor  #Labor: Progressing well, likely transitioning to active labor #Pain: S/p Fentanyl, does not desire epidural. History of unmedicated birth #FWB: Cat 1 #ID:  GBS pos, PCN  #MOF: breastfeeding #MOC: cu-IUD #Circ:  N/a girl  [name], MD  09/06/2018, 10:31 AM

## 2018-09-06 NOTE — Progress Notes (Signed)
Patient ID: Deborah Cardenas, female   DOB: 26-Apr-1985, 33 y.o.   MRN: [id number]  Labor Progress Note Deborah Cardenas is a 34 y.o. G2P1001 at [redacted]w[redacted]d presented for SOL  S: much more comfortable after epidural.  O:  BP 104/61   Pulse 81   Temp 98.4 F (36.9 C) (Oral)   Resp 18   Ht 5\' 2"  (1.575 m)   Wt 81.4 kg   LMP 12/14/2017 (Approximate)   SpO2 98%   BMI 32.83 kg/m  EFM: 120/mod/+accels, no decels  CVE: Dilation: 9 Effacement (%): 100 Cervical Position: Middle Station: -1 Presentation: Vertex(confirmed with bedside u/s) Exam by:: Dr Alvester Morin  IUPC placed easily Patient is vertex and fetus is ROT  A&P: 33 y.o. G2P1001 [redacted]w[redacted]d with SOL #Labor: Protracted 1st stage likely due to fetal position/malpresentation ROT. IPUC placed and plan to start pitocin 2x2 to help strengthen contractions. Infant poorly applied to cervix between contractions.  Threshold for CS defined as 4 hours with out cervical change despite adequate MVUs. Mode of delivery: guarded, hopeful for vaginal delivery.   #Pain:Epidural placed and providing good relief #FWB: Cat 1 #GBS positive- PCN, 2nd dose hung. AROM@1453   [name], MD 6:49 PM

## 2018-09-06 NOTE — Progress Notes (Signed)
LABOR PROGRESS NOTE  MARYL BRAMLET is a 33 y.o. G2P1001 at [redacted]w[redacted]d  admitted for SOL  Subjective: Patient comfortable with epidural   Objective: BP (!) 101/58   Pulse 79   Temp 98.5 F (36.9 C) (Oral)   Resp 16   Ht 5\' 2"  (1.575 m)   Wt 81.4 kg   LMP 12/14/2017 (Approximate)   SpO2 98%   BMI 32.83 kg/m  or  Vitals:   09/06/18 1820 09/06/18 1847 09/06/18 1902 09/06/18 2131  BP: 104/61 132/69 109/71 (!) 101/58  Pulse: 81 75 86 79  Resp: 18 18 16 16   Temp:  98.5 F (36.9 C)    TempSrc:  Oral    SpO2: 98%     Weight:      Height:        Repositioned with peanut to left side, most likely OP baby  Dilation: Lip/rim Effacement (%): 100 Cervical Position: Middle Station: -1 Presentation: Vertex Exam by:: Adelina Mings, RN FHT: baseline rate 125, moderate varibility, +accel, variable decel Toco: 1-5  Labs: Lab Results  Component Value Date   WBC 9.2 09/06/2018   HGB 12.7 09/06/2018   HCT 38.5 09/06/2018   MCV 95.1 09/06/2018   PLT 256 09/06/2018    Patient Active Problem List   Diagnosis Date Noted  . Labor and delivery indication for care or intervention 09/06/2018  . GBS (group B Streptococcus carrier), +RV culture, currently pregnant 08/13/2018  . Language barrier 03/31/2018  . Supervision of other normal pregnancy, antepartum 03/03/2018    Assessment / Plan: 33 y.o. G2P1001 at [redacted]w[redacted]d here for SOL  Labor: Stalled progression, most likely OP position, will recheck cervix at 2200 Fetal Wellbeing:  Cat II  Pain Control:  Epidural  Anticipated MOD:  Hopeful SVD  Sharyon Cable, CNM 09/06/2018, 9:55 PM

## 2018-09-07 LAB — HIV ANTIBODY (ROUTINE TESTING W REFLEX): HIV Screen 4th Generation wRfx: NONREACTIVE

## 2018-09-07 LAB — RPR: RPR Ser Ql: NONREACTIVE

## 2018-09-07 MED ORDER — DIBUCAINE (PERIANAL) 1 % EX OINT
1.0000 "application " | TOPICAL_OINTMENT | CUTANEOUS | Status: DC | PRN
  Administered 2018-09-07: 1 via RECTAL
  Filled 2018-09-07: qty 28

## 2018-09-07 MED ORDER — ONDANSETRON HCL 4 MG PO TABS: 4.0000 mg | ORAL_TABLET | ORAL | Status: DC | PRN

## 2018-09-07 MED ORDER — ZOLPIDEM TARTRATE 5 MG PO TABS: 5.0000 mg | ORAL_TABLET | Freq: Every evening | ORAL | Status: DC | PRN

## 2018-09-07 MED ORDER — SIMETHICONE 80 MG PO CHEW: 80.0000 mg | CHEWABLE_TABLET | ORAL | Status: DC | PRN

## 2018-09-07 MED ORDER — SENNOSIDES-DOCUSATE SODIUM 8.6-50 MG PO TABS
2.0000 | ORAL_TABLET | ORAL | Status: DC
  Filled 2018-09-07: qty 2

## 2018-09-07 MED ORDER — ONDANSETRON HCL 4 MG/2ML IJ SOLN: 4.0000 mg | INTRAMUSCULAR | Status: DC | PRN

## 2018-09-07 MED ORDER — COCONUT OIL OIL: 1.0000 "application " | TOPICAL_OIL | Status: DC | PRN

## 2018-09-07 MED ORDER — ACETAMINOPHEN 325 MG PO TABS
650.0000 mg | ORAL_TABLET | ORAL | Status: DC | PRN
  Administered 2018-09-07: 650 mg via ORAL
  Filled 2018-09-07: qty 2

## 2018-09-07 MED ORDER — BENZOCAINE-MENTHOL 20-0.5 % EX AERO
1.0000 "application " | INHALATION_SPRAY | CUTANEOUS | Status: DC | PRN
  Filled 2018-09-07: qty 56

## 2018-09-07 MED ORDER — IBUPROFEN 600 MG PO TABS
600.0000 mg | ORAL_TABLET | Freq: Four times a day (QID) | ORAL | Status: DC
  Administered 2018-09-07 – 2018-09-08 (×5): 600 mg via ORAL
  Filled 2018-09-07 (×5): qty 1

## 2018-09-07 MED ORDER — TETANUS-DIPHTH-ACELL PERTUSSIS 5-2.5-18.5 LF-MCG/0.5 IM SUSP: 0.5000 mL | Freq: Once | INTRAMUSCULAR | Status: DC

## 2018-09-07 MED ORDER — PRENATAL MULTIVITAMIN CH
1.0000 | ORAL_TABLET | Freq: Every day | ORAL | Status: DC
  Administered 2018-09-07 – 2018-09-08 (×2): 1 via ORAL
  Filled 2018-09-07 (×2): qty 1

## 2018-09-07 MED ORDER — DIPHENHYDRAMINE HCL 25 MG PO CAPS
25.0000 mg | ORAL_CAPSULE | Freq: Four times a day (QID) | ORAL | Status: DC | PRN
  Administered 2018-09-07: 25 mg via ORAL
  Filled 2018-09-07: qty 1

## 2018-09-07 MED ORDER — WITCH HAZEL-GLYCERIN EX PADS
1.0000 "application " | MEDICATED_PAD | CUTANEOUS | Status: DC | PRN
  Administered 2018-09-07: 1 via TOPICAL

## 2018-09-07 NOTE — Lactation Note (Signed)
This note was copied from a baby's chart. Lactation Consultation Note  Patient Name: Deborah Cardenas [id number] Date: 09/07/2018 Reason for consult: Initial assessment;Term;Other (Comment)(exp BF - 1  1/2 years // [place] interpreter - Nelta Numbers [id number] )  Baby 151/2 hours old , and has been to the breast several times , last fed was 1000 and its 1415.  Baby waking up and stirring when LC started consult.  LC offered to check diaper and it was dry.  LC placed baby STS and observed mom latching due to her being experienced.  Mom seemed awkward with latch at 1st, was receptive to pillow support and baby latched in a laid  Back position with several swallows. LC showed mom breast compressions and the swallows increased.  Per mom felt alittle pinching and LC added a pillow under baby's hips and per mom more comfortable.  Per mom active with [place] / WIC .  LC instructed mom on the use of a hand pump for D/C tomorrow/ not needed at this point / baby latching well.  #24 F good fit and per mom  comfortable.  Baby HNV and HNS.  Mom aware of [facility] LC resources after D/C.    Maternal Data Has patient been taught Hand Expression?: Yes Does the patient have breastfeeding experience prior to this delivery?: Yes  Feeding Feeding Type: Breast Fed  LATCH Score Latch: Grasps breast easily, tongue down, lips flanged, rhythmical sucking.  Audible Swallowing: Spontaneous and intermittent  Type of Nipple: Everted at rest and after stimulation  Comfort (Breast/Nipple): Soft / non-tender  Hold (Positioning): Assistance needed to correctly position infant at breast and maintain latch.(worked depth )  LATCH Score: 9  Interventions Interventions: Breast feeding basics reviewed;Assisted with latch;Skin to skin;Breast massage;Hand express;Breast compression;Adjust position;Support pillows;Position options;Hand pump  Lactation Tools Discussed/Used WIC Program: Yes Pump Review: Setup, frequency,  and cleaning;Milk Storage Initiated by:: MAI  Date initiated:: 09/07/18   Consult Status Consult Status: Follow-up Date: 09/08/18 Follow-up type: In-patient    Deborah Cardenas 09/07/2018, 3:47 PM

## 2018-09-07 NOTE — Progress Notes (Addendum)
Post Partum Day 1 Subjective: no complaints, up ad lib, voiding, tolerating PO and + flatus  Objective: Blood pressure 126/68, pulse 83, temperature 98 F (36.7 C), temperature source Oral, resp. rate 18, height 5\' 2"  (1.575 m), weight 81.4 kg, last menstrual period 12/14/2017, SpO2 100 %.  Physical Exam:  General: alert, cooperative and no distress Lochia: appropriate Uterine Fundus: firm Incision: NA DVT Evaluation: No evidence of DVT seen on physical exam.  Recent Labs    09/06/18 1004  HGB 12.7  HCT 38.5    Assessment/Plan: Plan for discharge tomorrow, Breastfeeding and Contraception Cu IUD placed in delivery room.    LOS: 1 day   Thressa Sheller DNP, CNM  09/07/18  10:22 AM   Int # [phone] used for encounter

## 2018-09-07 NOTE — Progress Notes (Signed)
Patient admitted to [facility], audio interpreter [phone] used.

## 2018-09-08 ENCOUNTER — Encounter (HOSPITAL_COMMUNITY): Payer: Self-pay | Admitting: *Deleted

## 2018-09-08 MED ORDER — IBUPROFEN 600 MG PO TABS: 600.0000 mg | ORAL_TABLET | Freq: Four times a day (QID) | ORAL | 0 refills | Status: DC

## 2018-09-08 NOTE — Progress Notes (Signed)
Interpreter [phone] used to assist with patient's questions this morning regarding her pain, request for early discharge and about her daughter's birth certificate.  Encouraged patient to request interpreter when needed by staff.  Deborah Cardenas

## 2018-09-08 NOTE — Anesthesia Postprocedure Evaluation (Signed)
Anesthesia Post Note  Patient: Deborah Cardenas  Procedure(s) Performed: AN AD HOC LABOR EPIDURAL     Patient location during evaluation: Mother Baby Anesthesia Type: Epidural Level of consciousness: awake and alert and oriented Pain management: satisfactory to patient Vital Signs Assessment: post-procedure vital signs reviewed and stable Respiratory status: spontaneous breathing and nonlabored ventilation Cardiovascular status: stable Postop Assessment: no headache, no backache, no signs of nausea or vomiting, adequate PO intake, patient able to bend at knees, able to ambulate and no apparent nausea or vomiting (patient up walking) Anesthetic complications: no    Last Vitals:  Vitals:   09/08/18 0512 09/08/18 0530  BP: (!) 156/144 116/67  Pulse: 70   Resp: 18   Temp: 36.6 C   SpO2: 97%     Last Pain:  Vitals:   09/08/18 0530  TempSrc:   PainSc: 4    Pain Goal: Patients Stated Pain Goal: 0 (09/06/18 0851)                 Madison Hickman

## 2018-09-08 NOTE — Lactation Note (Signed)
This note was copied from a baby's chart. Lactation Consultation Note:  [place] Interpreter ID # [phone] Nicole Kindred on line for all teaching.  Arrived in mothers room and Mother reports that nipples are very painful.  Mother has infant at the Rt breast in cradle hold with no pillow support. Mother has positional strip and pinching on the rt nipple when infant released the breast. Mother reports that it is a #5 pain scale when infant is breastfeeding.   Mother taught to use football hold with pillow support. Infant sustained latch for 25 mins. Mother complaints of pain of  arms and wrist from holding infant for the entire feeding.  Mother quiet difficult to work with due to she complaints of hold infant so long and having to hold her breast and his head for support.   Lots of teaching with this mother. Mother advised that if infant didn't get a good deep latch that she would continue to have pain on her nipples and that infant wouldn't get enough to drink.   Infant sustained latch for 25 mins.  Mother  Taught to use cross cradle hold and she reports liking this position better. Mother advised to do breast compression. Mother more receptive to teaching .  Infant sustained latch for 20 mins.   Mothers nipple more round when infant released the breast.  Mother was given comfort gels. Advised to hand express and use milk to apply to nipples.  She reports that the nurse gave her a hand pump. She would  Like someone to phone in her lunch for her and have someone show her how to use the hand pump.  Mother is active with [facility] and informed to follow up with peer counselor as needed.  Mother informed to follow up with [facility] office as needed. She was given [facility] phone number.     Patient Name: Deborah Cardenas [id number] Date: 09/08/2018 Reason for consult: Follow-up assessment   Maternal Data    Feeding Feeding Type: (all teaching with french interpreter ID # [phone] Nicole Kindred)  LATCH Score Latch: Grasps  breast easily, tongue down, lips flanged, rhythmical sucking.  Audible Swallowing: Spontaneous and intermittent  Type of Nipple: Everted at rest and after stimulation  Comfort (Breast/Nipple): Filling, red/small blisters or bruises, mild/mod discomfort(positional strip on the right nipple)  Hold (Positioning): Assistance needed to correctly position infant at breast and maintain latch.  LATCH Score: 8  Interventions Interventions: Breast massage;Hand express;Breast compression;Adjust position;Support pillows;Position options;Expressed milk;Comfort gels;Hand pump;Ice  Lactation Tools Discussed/Used     Consult Status Consult Status: Complete    Michel Bickers 09/08/2018, 2:23 PM

## 2018-09-08 NOTE — Discharge Instructions (Signed)

## 2018-09-09 ENCOUNTER — Encounter: Payer: Medicaid Other | Admitting: Obstetrics and Gynecology

## 2018-09-10 ENCOUNTER — Other Ambulatory Visit (HOSPITAL_COMMUNITY)
Admission: RE | Admit: 2018-09-10 | Discharge: 2018-09-10 | Disposition: A | Discharge status: Home / Self Care | Payer: Medicaid Other | Source: Ambulatory Visit

## 2018-09-13 ENCOUNTER — Inpatient Hospital Stay (HOSPITAL_COMMUNITY): Payer: Medicaid Other

## 2018-10-08 ENCOUNTER — Encounter: Payer: Self-pay | Admitting: Certified Nurse Midwife

## 2018-10-08 ENCOUNTER — Other Ambulatory Visit: Payer: Self-pay

## 2018-10-08 ENCOUNTER — Ambulatory Visit (INDEPENDENT_AMBULATORY_CARE_PROVIDER_SITE_OTHER): Payer: Medicaid Other | Admitting: Certified Nurse Midwife

## 2018-10-08 DIAGNOSIS — Z975 Presence of (intrauterine) contraceptive device: Secondary | ICD-10-CM

## 2018-10-08 DIAGNOSIS — K648 Other hemorrhoids: Secondary | ICD-10-CM

## 2018-10-08 MED ORDER — PREPARATION H 0.25-88.44 % RE SUPP: 1.0000 | RECTAL | 0 refills | Status: DC | PRN

## 2018-10-08 NOTE — Progress Notes (Signed)
Post Partum Exam  Deborah Cardenas is a 33 y.o. G43P1001 female who presents for a postpartum visit. She is 4 weeks postpartum following a spontaneous vaginal delivery. I have fully reviewed the prenatal and intrapartum course. The delivery was at 40 gestational weeks.  Anesthesia: epidural. Postpartum course has been well. Baby's course has been well. Baby is feeding by breast. Bleeding staining only. Bowel function is normal.C/O: Hemorrhoids. Bladder function is normal. Patient is not sexually active. Contraception method is IUD. Postpartum depression screening:neg EPDS = 2   The following portions of the patient's history were reviewed and updated as appropriate: allergies, current medications, past medical history, past surgical history and problem list. Last pap smear done 03/03/2018 and was Normal  Review of Systems Pertinent items noted in HPI and remainder of comprehensive ROS otherwise negative.    Objective:  unknown if currently breastfeeding.  General:  alert, cooperative and no distress   Breasts:  negative  Lungs: clear to auscultation bilaterally  Heart:  regular rate and rhythm  Abdomen: soft, non-tender; bowel sounds normal; no masses,  no organomegaly   Vulva:  normal  Vagina: normal vagina, small amount of dark red vaginal bleeding present, exudate, lesion, or erythema  Cervix:  retroverted  Corpus: normal  Adnexa:  normal adnexa  Rectal Exam: internal hemorrhoids        Assessment/Plan:  1. Internal hemorrhoid - Patient reports hemorrhoid has returned since delivery, reports it being inside and not outside- hurts with bowel movement  - Denies rectal bleeding, has not used preparation H or tucks since delivery  - Educated on use of preparation H suppositories for internal hemorrhoid, increasing water consumption and dark leafy greens to soften stools  - shark liver oil-cocoa butter (PREPARATION H) 0.25-88.44 % suppository; Place 1 suppository rectally as needed for  hemorrhoids.  Dispense: 12 suppository; Refill: 0  2. Postpartum care and examination -Normal postpartum exam. Pap smear not done at today's visit. Normal pap on 02/2018  3. IUD (intrauterine device) in place - Paragard IUD placed postplacentally, IUD stings seen inside cervical os with cytobrush   Contraception: IUD Follow up in: 1 year for well women visit or as needed.   Lajean Manes, CNM 10/08/18, 9:54 AM

## 2019-04-12 ENCOUNTER — Other Ambulatory Visit: Payer: Self-pay

## 2019-04-12 ENCOUNTER — Encounter: Payer: Self-pay | Admitting: Obstetrics and Gynecology

## 2019-04-12 ENCOUNTER — Ambulatory Visit (INDEPENDENT_AMBULATORY_CARE_PROVIDER_SITE_OTHER): Payer: Medicaid Other | Admitting: Obstetrics and Gynecology

## 2019-04-12 VITALS — BP 115/77 | HR 71 | Wt 159.0 lb

## 2019-04-12 DIAGNOSIS — Z30431 Encounter for routine checking of intrauterine contraceptive device: Secondary | ICD-10-CM | POA: Diagnosis not present

## 2019-04-12 NOTE — Progress Notes (Signed)
33 yo G2P2 s/p IUD placement in 08/2017 presenting today for IUD check. Patient IUD placed post placenta. She denies any menses since placement. She is sexually active without complaints. She reports noticing a rolling sensation following consumption of food which she is attributing to the IUD. This rolling sensation takes place in her upper abdomen. She also reports some occasional abdominal pain which ceases spontaneously  No past medical history on file. Past Surgical History:  Procedure Laterality Date  . NO PAST SURGERIES     No family history on file. Social History   Tobacco Use  . Smoking status: Never Smoker  . Smokeless tobacco: Never Used  Substance Use Topics  . Alcohol use: Never  . Drug use: Never   Review of Systems  All other systems reviewed and are negative.  Today's Vitals   04/12/19 1511  BP: 115/77  Pulse: 71  Weight: 159 lb (72.1 kg)   Body mass index is 29.08 kg/m.;  GENERAL: Well-developed, well-nourished female in no acute distress.  ABDOMEN: Soft, nontender, nondistended. Small supraumbilical hernia in are pf concern for the patient PELVIC: Normal external female genitalia. Vagina is pink and rugated.  Normal discharge. Normal appearing cervix with IUD strings not visualized at the os . Uterus is normal in size. No adnexal mass or tenderness. EXTREMITIES: No cyanosis, clubbing, or edema, 2+ distal pulses.  A/P 33 yo here for IUD check - Pelvic ultrasound ordered to assess IUD location - Discussed possibility of her hernia - Patient to follow up with PCP - Patient will be contacted with results

## 2019-04-12 NOTE — Progress Notes (Signed)
Pt is here today with IUD concerns. IUD place postplacentally in 08/2017.

## 2019-04-26 ENCOUNTER — Ambulatory Visit (HOSPITAL_COMMUNITY): Payer: Medicaid Other

## 2019-04-28 ENCOUNTER — Other Ambulatory Visit: Payer: Self-pay

## 2019-04-28 ENCOUNTER — Ambulatory Visit (HOSPITAL_COMMUNITY)
Admission: RE | Admit: 2019-04-28 | Discharge: 2019-04-28 | Disposition: A | Discharge status: Home / Self Care | Payer: Medicaid Other | Source: Ambulatory Visit | Attending: Obstetrics and Gynecology | Admitting: Obstetrics and Gynecology

## 2019-04-28 DIAGNOSIS — Z30431 Encounter for routine checking of intrauterine contraceptive device: Secondary | ICD-10-CM | POA: Insufficient documentation

## 2019-05-04 ENCOUNTER — Other Ambulatory Visit: Payer: Self-pay | Admitting: Obstetrics and Gynecology

## 2019-05-04 DIAGNOSIS — R1033 Periumbilical pain: Secondary | ICD-10-CM

## 2019-05-09 IMAGING — US US MFM OB COMP +14 WKS
1 series · 13 of 28 positions shown · non-contrast
Comparison: none

[Series 1: us mfm ob comp +14 wks · 81 acquisitions, 13 frames shown]
[im 3/81]
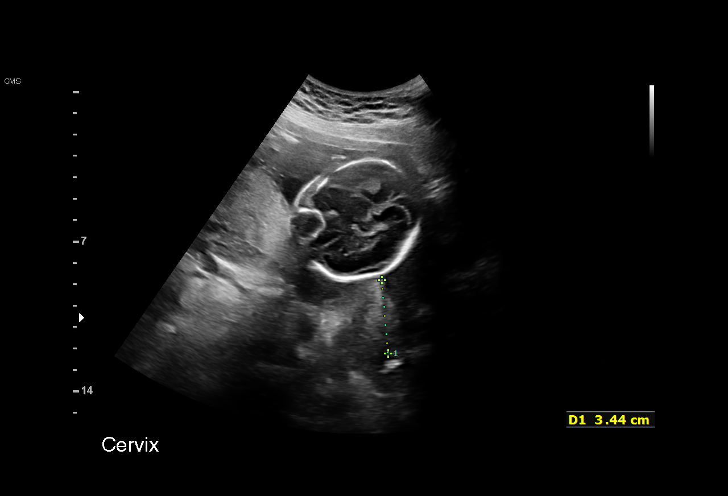
[im 9/81]
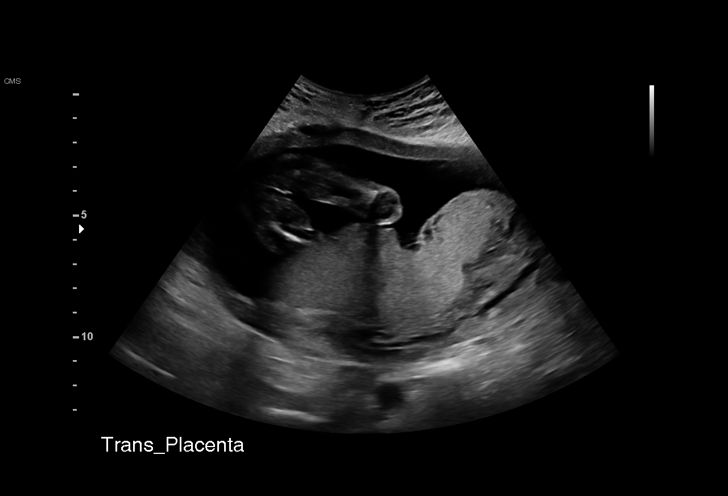
[im 15/81]
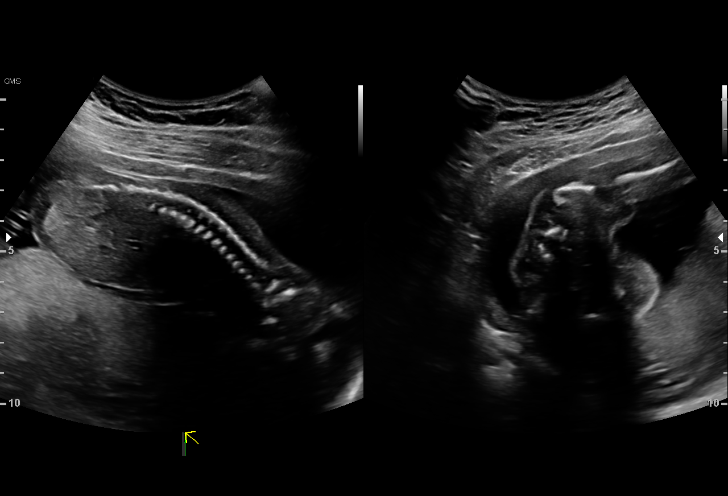
[im 21/81]
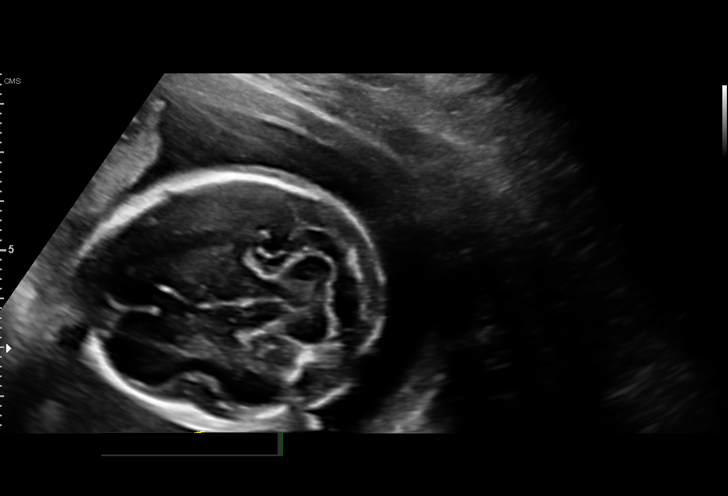
[im 27/81]
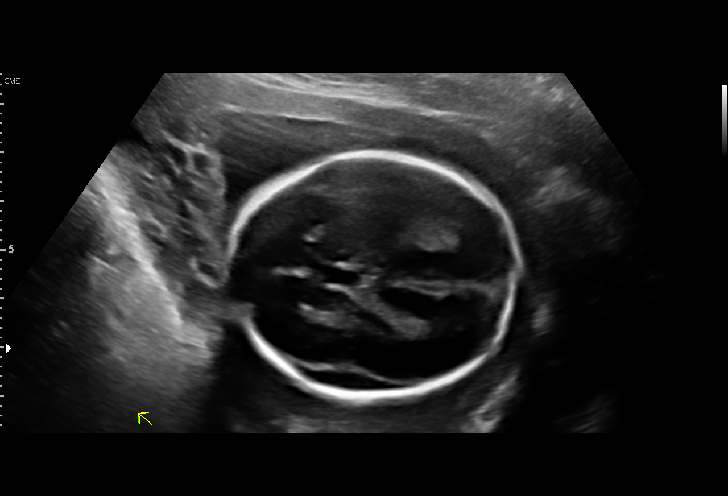
[im 33/81]
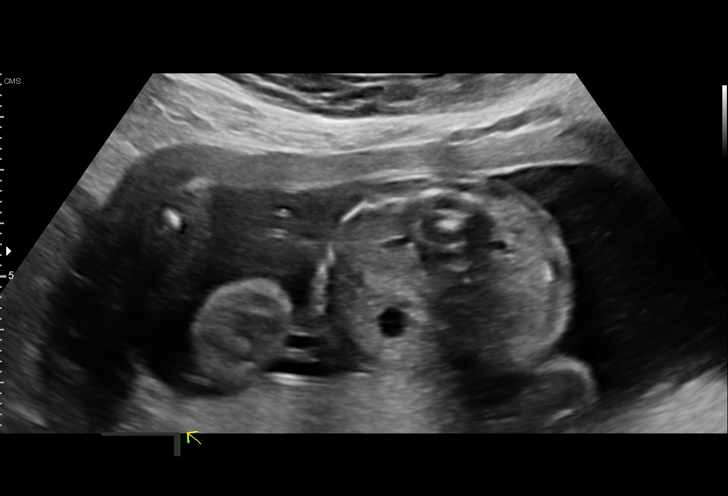
[im 42/81]
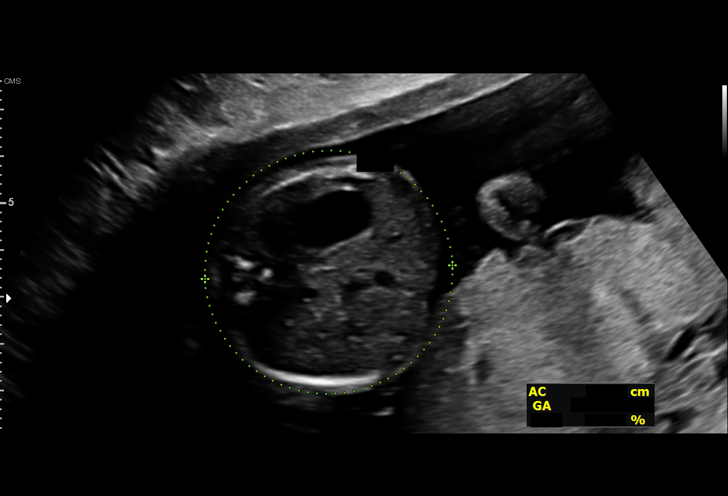
[im 48/81]
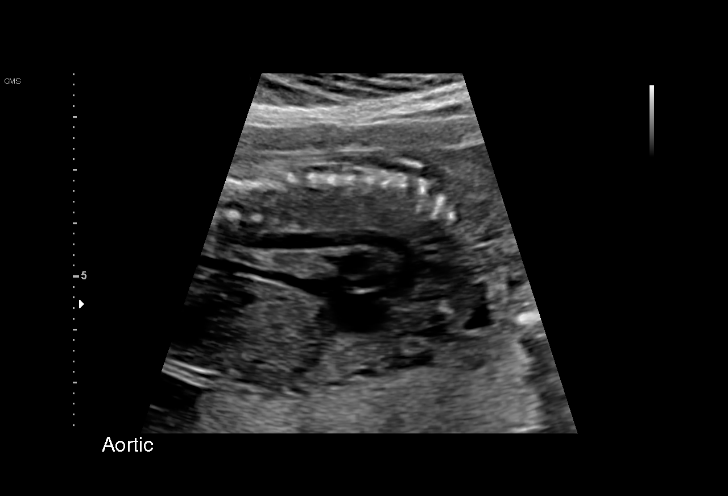
[im 54/81]
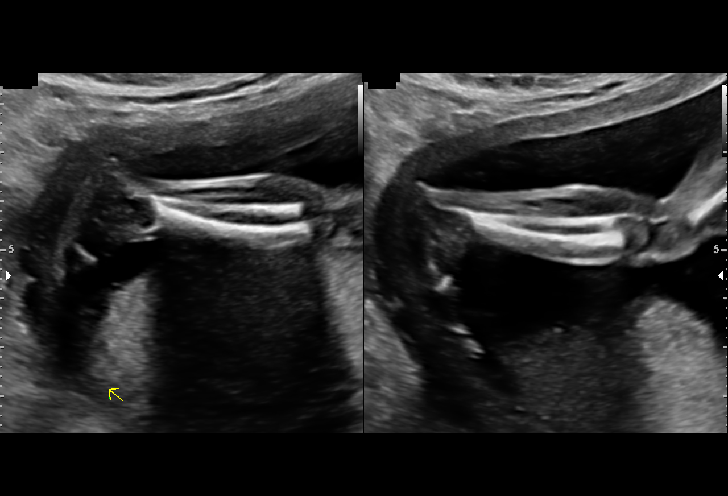
[im 60/81]
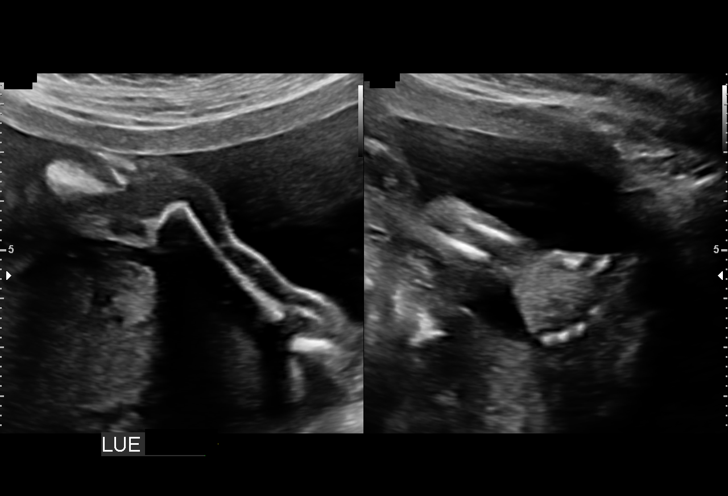
[im 66/81]
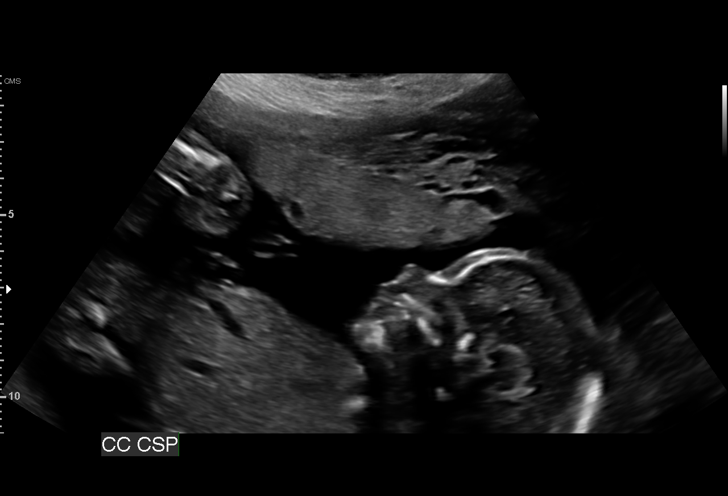
[im 72/81]
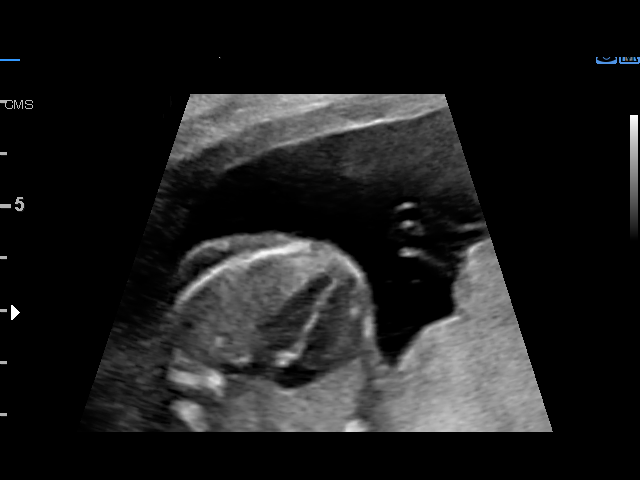
[im 78/81]
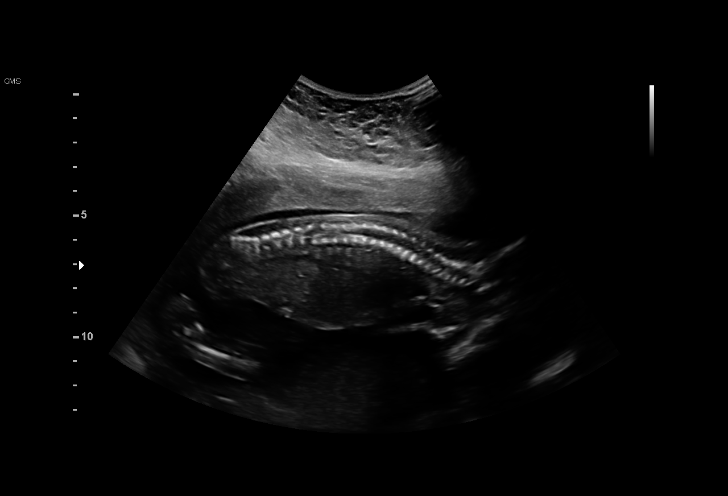

[13 of 28 positions shown; findings below may reference images not displayed]

[REDACTED]care - [HOSPITAL]

  1  US MFM OB COMP + 14 WK               76805.01     SORI LANDRY
 ----------------------------------------------------------------------

 ----------------------------------------------------------------------
Indications

  Encounter for antenatal screening for
  malformations
  Encounter for uncertain dates
  21 weeks gestation of pregnancy
 ----------------------------------------------------------------------
Fetal Evaluation

 Num Of Fetuses:          1
 Fetal Heart Rate(bpm):   148
 Cardiac Activity:        Observed
 Presentation:            Cephalic
 Placenta:                Posterior
 P. Cord Insertion:       Visualized

 Amniotic Fluid
 AFI FV:      Within normal limits

                             Largest Pocket(cm)
                             6
Biometry

 BPD:      50.4  mm     G. Age:  21w 2d         54  %    CI:        72.68   %    70 - 86
                                                         FL/HC:       18.2  %    15.9 -
 HC:       188   mm     G. Age:  21w 1d         39  %    HC/AC:       1.17       1.06 -
 AC:      160.5  mm     G. Age:  21w 1d         44  %    FL/BPD:      68.1  %
 FL:       34.3  mm     G. Age:  20w 6d         29  %    FL/AC:       21.4  %    20 - 24
 HUM:      31.6  mm     G. Age:  20w 4d         31  %
 CER:      20.4  mm     G. Age:  19w 3d         10  %
 NFT:       4.1  mm
 CM:        5.9  mm

 Est. FW:     393   gm   0 lb 14 oz      41  %
OB History

 Gravidity:    2         Term:   1        Prem:   0        SAB:   0
 TOP:          0       Ectopic:  0        Living: 1
Gestational Age

 LMP:           19w 1d        Date:  12/14/17                 EDD:   09/20/18
 U/S Today:     21w 1d                                        EDD:   09/06/18
 Best:          21w 1d     Det. By:  U/S (04/27/18)           EDD:   09/06/18
Anatomy

 Cranium:               Appears normal         LVOT:                   Appears normal
 Cavum:                 Appears normal         Aortic Arch:            Appears normal
 Ventricles:            Appears normal         Ductal Arch:            Appears normal
 Choroid Plexus:        Appears normal         Diaphragm:              Appears normal
 Cerebellum:            Appears normal         Stomach:                Appears normal, left
                                                                       sided
 Posterior Fossa:       Appears normal         Abdomen:                Appears normal
 Nuchal Fold:           Not applicable (>20    Abdominal Wall:         Appears nml (cord
                        wks GA)                                        insert, abd wall)
 Face:                  Appears normal         Cord Vessels:           Appears normal (3
                        (orbits and profile)                           vessel cord)
 Lips:                  Appears normal         Kidneys:                Appear normal
 Palate:                Not well visualized    Bladder:                Appears normal
 Thoracic:              Appears normal         Spine:                  Appears normal
 Heart:                 Appears normal         Upper Extremities:      Appears normal
                        (4CH, axis, and
                        situs)
 RVOT:                  Not well visualized    Lower Extremities:      Appears normal

 Other:  Heels visualized.
Cervix Uterus Adnexa

 Cervix
 Length:            3.4  cm.
 Normal appearance by transabdominal scan.

 Uterus
 No abnormality visualized.

 Left Ovary
 Within normal limits.

 Right Ovary
 Within normal limits.

 Cul De Sac
 No free fluid seen.

 Adnexa
 No abnormality visualized.
Impression

 Normal interval growth.  No ultrasonic evidence of structural
 fetal anomalies.
 Suboptimal views of the heart obtained
 Low risk NIPS and AFP
Recommendations

 Follow up growth in 4-6 weeks.

## 2019-06-29 ENCOUNTER — Telehealth: Payer: Self-pay

## 2019-06-29 NOTE — Telephone Encounter (Signed)
Called patient to do their pre-visit COVID screening(Deborah Cardenas, [id number]).  Call went to voicemail. Unable to do prescreening.

## 2019-06-30 ENCOUNTER — Other Ambulatory Visit: Payer: Self-pay

## 2019-06-30 ENCOUNTER — Encounter: Payer: Self-pay | Admitting: Internal Medicine

## 2019-06-30 ENCOUNTER — Ambulatory Visit (INDEPENDENT_AMBULATORY_CARE_PROVIDER_SITE_OTHER): Payer: Medicaid Other | Admitting: Internal Medicine

## 2019-06-30 VITALS — BP 155/80 | HR 79 | Temp 97.3°F | Resp 17 | Ht 62.0 in | Wt 153.0 lb

## 2019-06-30 DIAGNOSIS — R030 Elevated blood-pressure reading, without diagnosis of hypertension: Secondary | ICD-10-CM

## 2019-06-30 DIAGNOSIS — K429 Umbilical hernia without obstruction or gangrene: Secondary | ICD-10-CM | POA: Diagnosis not present

## 2019-06-30 DIAGNOSIS — Z7689 Persons encountering health services in other specified circumstances: Secondary | ICD-10-CM

## 2019-06-30 DIAGNOSIS — K644 Residual hemorrhoidal skin tags: Secondary | ICD-10-CM

## 2019-06-30 MED ORDER — PREPARATION H 0.25-14-74.9 % RE OINT: 1.0000 "application " | TOPICAL_OINTMENT | Freq: Two times a day (BID) | RECTAL | 0 refills | Status: DC | PRN

## 2019-06-30 NOTE — Patient Instructions (Signed)
Thank you for choosing Primary Care at [facility] to be your medical home!    Deborah Cardenas was seen by De Hollingshead, DO today.   Deborah Cardenas's primary care provider is Marcy Siren, DO.   For the best care possible, you should try to see Marcy Siren, DO whenever you come to the clinic.   We look forward to seeing you again soon!  If you have any questions about your visit today, please call us at [phone] or feel free to reach your primary care provider via MyChart.

## 2019-06-30 NOTE — Progress Notes (Signed)
  Subjective:    Deborah Cardenas - 34 y.o. female MRN [id number]  Date of birth: [birth year]  HPI  Deborah Cardenas is to establish care. Patient has a PMH significant for none. Takes no  Medications.   Patient is concerned about abdominal mass. Has been present since delivery of her child 10 months ago. She had a vaginal delivery. She can palpate the mass. Located on the right side. Never painful. It is present more frequently after eating. She can see it protruding if she coughs.     ROS per HPI     Health Maintenance:  There are no preventive care reminders to display for this patient.   Past Medical History: Patient Active Problem List   Diagnosis Date Noted  . Labor and delivery indication for care or intervention 09/06/2018  . SVD (spontaneous vaginal delivery) 09/06/2018  . IUD (intrauterine device) in place 09/06/2018  . GBS (group B Streptococcus carrier), +RV culture, currently pregnant 08/13/2018  . Language barrier 03/31/2018  . Supervision of other normal pregnancy, antepartum 03/03/2018      Social History   reports that she has never smoked. She has never used smokeless tobacco. She reports that she does not drink alcohol or use drugs.   Family History  family history is not on file.   Medications: reviewed and updated   Objective:   Physical Exam BP (!) 155/80   Pulse 79   Temp (!) 97.3 F (36.3 C) (Temporal)   Resp 17   Ht 5\' 2"  (1.575 m)   Wt 153 lb (69.4 kg)   SpO2 98%   Breastfeeding Yes   BMI 27.98 kg/m  Physical Exam  Constitutional: She is oriented to person, place, and time and well-developed, well-nourished, and in no distress. No distress.  Cardiovascular: Normal rate.  Pulmonary/Chest: Effort normal. No respiratory distress.  Abdominal:  Umbilical hernia appreciated superior to umbilicus. Reducible and not painful.   Musculoskeletal:        General: Normal range of motion.  Neurological: She is alert and oriented to person, place,  and time.  Skin: Skin is warm and dry. She is not diaphoretic.  Psychiatric: Affect and judgment normal.        Assessment & Plan:    1. Encounter to establish care   2. Umbilical hernia without obstruction and without gangrene Reducible and non painful at present. Dicussed option of referral to general surgery vs. Watchful waiting. Patient elects to defer surgical consultation at present. Discussed signs of strangulation that would warrant medical care immediately.   3. Elevated blood pressure reading without diagnosis of hypertension All prior BPs have been well within normotensive range. Will continue to monitor.   4. External hemorrhoid Discussed these are common with pregnacy and postpartum. Patient reports she has regular BMs and does not strain. Eats veggies to avoid constipation. Asks for ointment for occasional need for pain relief.  - phenylephrine-shark liver oil-mineral oil-petrolatum (PREPARATION H) 0.25-14-74.9 % rectal ointment; Place 1 application rectally 2 (two) times daily as needed for hemorrhoids.  Dispense: 56 g; Refill: 0   06-03-1999, D.O. 06/30/2019, 3:40 PM Primary Care at [facility]

## 2019-08-06 ENCOUNTER — Telehealth: Payer: Self-pay

## 2019-08-06 NOTE — Patient Instructions (Signed)

## 2019-08-06 NOTE — Telephone Encounter (Signed)
Called patient to do their pre-visit COVID screening.  Call went to voicemail. Unable to do prescreening.  

## 2019-08-09 ENCOUNTER — Encounter: Payer: Self-pay | Admitting: Internal Medicine

## 2019-08-09 ENCOUNTER — Other Ambulatory Visit: Payer: Self-pay

## 2019-08-09 ENCOUNTER — Ambulatory Visit (INDEPENDENT_AMBULATORY_CARE_PROVIDER_SITE_OTHER): Payer: Medicaid Other | Admitting: Internal Medicine

## 2019-08-09 VITALS — BP 111/74 | HR 64 | Temp 97.2°F | Resp 17 | Ht 62.0 in | Wt 152.0 lb

## 2019-08-09 DIAGNOSIS — Z0000 Encounter for general adult medical examination without abnormal findings: Secondary | ICD-10-CM | POA: Diagnosis not present

## 2019-08-09 DIAGNOSIS — Z113 Encounter for screening for infections with a predominantly sexual mode of transmission: Secondary | ICD-10-CM

## 2019-08-09 NOTE — Addendum Note (Signed)
Addended by: Heidi Dach on: 08/09/2019 09:45 AM   Modules accepted: Orders

## 2019-08-09 NOTE — Progress Notes (Signed)
  Subjective:    Deborah Cardenas - 34 y.o. female MRN [id number]  Date of birth: March 15, 1986  HPI  Deborah Cardenas is here for annual exam. Has no concerns today.      Health Maintenance:  There are no preventive care reminders to display for this patient.  -  reports that she has never smoked. She has never used smokeless tobacco. - Review of Systems: Per HPI. - Past Medical History: Patient Active Problem List   Diagnosis Date Noted  . IUD (intrauterine device) in place 09/06/2018  . Language barrier 03/31/2018   - Medications: reviewed and updated   Objective:   Physical Exam BP 111/74   Pulse 64   Temp (!) 97.2 F (36.2 C) (Temporal)   Resp 17   Ht 5\' 2"  (1.575 m)   Wt 152 lb (68.9 kg)   SpO2 97%   BMI 27.80 kg/m  Physical Exam  Constitutional: She is oriented to person, place, and time and well-developed, well-nourished, and in no distress.  HENT:  Head: Normocephalic and atraumatic.  Mouth/Throat: Oropharynx is clear and moist.  Eyes: Pupils are equal, round, and reactive to light. Conjunctivae and EOM are normal.  Neck: No thyromegaly present.  Cardiovascular: Normal rate, regular rhythm, normal heart sounds and intact distal pulses.  No murmur heard. Pulmonary/Chest: Effort normal and breath sounds normal. No respiratory distress. She has no wheezes.  Abdominal: Soft. Bowel sounds are normal. She exhibits no distension. There is no abdominal tenderness. There is no rebound and no guarding.  Musculoskeletal:        General: No deformity or edema. Normal range of motion.     Cervical back: Normal range of motion and neck supple.  Lymphadenopathy:    She has no cervical adenopathy.  Neurological: She is alert and oriented to person, place, and time. Gait normal.  Skin: Skin is warm and dry. No rash noted. She is not diaphoretic.  Psychiatric: Mood, affect and judgment normal.           Assessment & Plan:   1. Annual physical exam Counseled on 150  minutes of exercise per week, healthy eating (including decreased daily intake of saturated fats, cholesterol, added sugars, sodium), STI prevention, routine healthcare maintenance. Up to date on cervical cancer screening.  - CBC with Differential - Comprehensive metabolic panel - Lipid Panel  2. Screening for STDs (sexually transmitted diseases) - RPR - GC/Chlamydia Probe Amp([facility]) - HIV antibody (with reflex)     , D.O. 08/09/2019, 9:24 AM Primary Care at [facility]

## 2019-08-10 LAB — HIV ANTIBODY (ROUTINE TESTING W REFLEX): HIV Screen 4th Generation wRfx: NONREACTIVE

## 2019-08-10 LAB — CBC WITH DIFFERENTIAL/PLATELET
Basophils Absolute: 0 10*3/uL (ref 0.0–0.2)
Basos: 0 %
EOS (ABSOLUTE): 0.5 10*3/uL — ABNORMAL HIGH (ref 0.0–0.4)
Eos: 9 %
Hematocrit: 38.5 % (ref 34.0–46.6)
Hemoglobin: 12.4 g/dL (ref 11.1–15.9)
Immature Grans (Abs): 0 10*3/uL (ref 0.0–0.1)
Immature Granulocytes: 0 %
Lymphocytes Absolute: 2 10*3/uL (ref 0.7–3.1)
Lymphs: 39 %
MCH: 31.2 pg (ref 26.6–33.0)
MCHC: 32.2 g/dL (ref 31.5–35.7)
MCV: 97 fL (ref 79–97)
Monocytes Absolute: 0.3 10*3/uL (ref 0.1–0.9)
Monocytes: 5 %
Neutrophils Absolute: 2.4 10*3/uL (ref 1.4–7.0)
Neutrophils: 47 %
Platelets: 249 10*3/uL (ref 150–450)
RBC: 3.98 x10E6/uL (ref 3.77–5.28)
RDW: 12 % (ref 11.7–15.4)
WBC: 5.1 10*3/uL (ref 3.4–10.8)

## 2019-08-10 LAB — LIPID PANEL
Chol/HDL Ratio: 3.7 ratio (ref 0.0–4.4)
Cholesterol, Total: 218 mg/dL — ABNORMAL HIGH (ref 100–199)
HDL: 59 mg/dL (ref >39)
LDL Chol Calc (NIH): 153 mg/dL — ABNORMAL HIGH (ref 0–99)
Triglycerides: 36 mg/dL (ref 0–149)
VLDL Cholesterol Cal: 6 mg/dL (ref 5–40)

## 2019-08-10 LAB — COMPREHENSIVE METABOLIC PANEL
ALT: 24 IU/L (ref 0–32)
AST: 29 IU/L (ref 0–40)
Albumin/Globulin Ratio: 1.3 (ref 1.2–2.2)
Albumin: 4.5 g/dL (ref 3.8–4.8)
Alkaline Phosphatase: 86 IU/L (ref 39–117)
BUN/Creatinine Ratio: 24 — ABNORMAL HIGH (ref 9–23)
BUN: 18 mg/dL (ref 6–20)
Bilirubin Total: 0.3 mg/dL (ref 0.0–1.2)
CO2: 21 mmol/L (ref 20–29)
Calcium: 9.5 mg/dL (ref 8.7–10.2)
Chloride: 106 mmol/L (ref 96–106)
Creatinine, Ser: 0.74 mg/dL (ref 0.57–1.00)
GFR calc Af Amer: 122 mL/min/{1.73_m2} (ref >59)
GFR calc non Af Amer: 106 mL/min/{1.73_m2} (ref >59)
Globulin, Total: 3.6 g/dL (ref 1.5–4.5)
Glucose: 85 mg/dL (ref 65–99)
Potassium: 4.2 mmol/L (ref 3.5–5.2)
Sodium: 142 mmol/L (ref 134–144)
Total Protein: 8.1 g/dL (ref 6.0–8.5)

## 2019-08-10 LAB — RPR: RPR Ser Ql: NONREACTIVE

## 2019-08-10 NOTE — Progress Notes (Signed)
Patient notified of results & recommendations. Expressed understanding.

## 2019-12-15 ENCOUNTER — Other Ambulatory Visit: Payer: Self-pay

## 2019-12-15 ENCOUNTER — Ambulatory Visit (INDEPENDENT_AMBULATORY_CARE_PROVIDER_SITE_OTHER): Payer: Medicaid Other

## 2019-12-15 DIAGNOSIS — Z23 Encounter for immunization: Secondary | ICD-10-CM | POA: Diagnosis not present

## 2019-12-15 NOTE — Progress Notes (Signed)
   Covid-19 Vaccination Clinic  Name:  Deborah Cardenas    MRN: [id number] DOB: 07-19-1985  12/15/2019  [name] was observed post Covid-19 immunization for 15 minutes without incident. She was provided with Vaccine Information Sheet and instruction to access the V-Safe system.   [name] was instructed to call 911 with any severe reactions post vaccine: Marland Kitchen Difficulty breathing  . Swelling of face and throat  . A fast heartbeat  . A bad rash all over body  . Dizziness and weakness   Immunizations Administered    Name Date Dose VIS Date Route   Pfizer COVID-19 Vaccine 12/15/2019  3:55 PM 0.3 mL 06/16/2018 Intramuscular   Manufacturer: [employer], [employer]   Lot: [id number]   NDC: [address]-0

## 2020-01-08 ENCOUNTER — Ambulatory Visit (INDEPENDENT_AMBULATORY_CARE_PROVIDER_SITE_OTHER): Payer: Medicaid Other

## 2020-01-08 ENCOUNTER — Other Ambulatory Visit: Payer: Self-pay

## 2020-01-08 VITALS — Wt 152.0 lb

## 2020-01-08 DIAGNOSIS — Z23 Encounter for immunization: Secondary | ICD-10-CM

## 2020-01-08 NOTE — Progress Notes (Signed)
   Covid-19 Vaccination Clinic  Name:  OLINDA NOLA    MRN: [id number] DOB: 09-04-1985  01/08/2020  Ms. Lotspeich was observed post Covid-19 immunization for 15 minutes without incident. She was provided with Vaccine Information Sheet and instruction to access the V-Safe system.   Ms. Mccreadie was instructed to call 911 with any severe reactions post vaccine: Marland Kitchen Difficulty breathing  . Swelling of face and throat  . A fast heartbeat  . A bad rash all over body  . Dizziness and weakness   Immunizations Administered    Name Date Dose VIS Date Route   Pfizer COVID-19 Vaccine 01/08/2020 10:10 AM 0.3 mL 06/16/2018 Intramuscular   Manufacturer: [employer], [employer]   Lot: [id number]   NDC: [address]-2

## 2020-01-13 ENCOUNTER — Ambulatory Visit: Payer: Medicaid Other

## 2020-01-20 ENCOUNTER — Encounter (HOSPITAL_COMMUNITY): Payer: Self-pay

## 2020-01-20 ENCOUNTER — Ambulatory Visit (HOSPITAL_COMMUNITY)
Admission: EM | Admit: 2020-01-20 | Discharge: 2020-01-20 | Disposition: A | Discharge status: Home / Self Care | Payer: Medicaid Other | Attending: Family Medicine | Admitting: Family Medicine

## 2020-01-20 ENCOUNTER — Other Ambulatory Visit: Payer: Self-pay

## 2020-01-20 DIAGNOSIS — R21 Rash and other nonspecific skin eruption: Secondary | ICD-10-CM | POA: Diagnosis not present

## 2020-01-20 MED ORDER — CETIRIZINE HCL 10 MG PO TABS: 10.0000 mg | ORAL_TABLET | Freq: Every day | ORAL | 1 refills | Status: DC

## 2020-01-20 MED ORDER — TRIAMCINOLONE ACETONIDE 0.1 % EX CREA: 1.0000 "application " | TOPICAL_CREAM | Freq: Two times a day (BID) | CUTANEOUS | 0 refills | Status: DC | PRN

## 2020-01-20 NOTE — ED Provider Notes (Signed)
[facility]    CSN: [id number] Arrival date & time: 01/20/20  0825      History   Chief Complaint Chief Complaint  Patient presents with  . Rash    HPI Deborah Cardenas is a 34 y.o. female.   Patient presenting today for itchy red bumps on shoulders and upper back waxing and waning the past 2-3 weeks. Son also has similar rash appearing. She denies fever, wheezing, rhinorrhea, CP, SOB, new hygiene products or foods/supplements. Has not been trying anything at home for this issue. States this has never happened to her before.      History reviewed. No pertinent past medical history.  Patient Active Problem List   Diagnosis Date Noted  . IUD (intrauterine device) in place 09/06/2018  . Language barrier 03/31/2018    Past Surgical History:  Procedure Laterality Date  . NO PAST SURGERIES      OB History    Gravida  2   Para  1   Term  1   Preterm      AB      Living  1     SAB      TAB      Ectopic      Multiple      Live Births  1            Home Medications    Prior to Admission medications   Medication Sig Start Date End Date Taking? Authorizing Provider  cetirizine (ZYRTEC ALLERGY) 10 MG tablet Take 1 tablet (10 mg total) by mouth daily. 01/20/20   Particia Nearing, PA-C  phenylephrine-shark liver oil-mineral oil-petrolatum (PREPARATION H) 0.25-14-74.9 % rectal ointment Place 1 application rectally 2 (two) times daily as needed for hemorrhoids. 06/30/19   Arvilla Market, DO  triamcinolone cream (KENALOG) 0.1 % Apply 1 application topically 2 (two) times daily as needed. 01/20/20   Particia Nearing, PA-C    Family History History reviewed. No pertinent family history.  Social History Social History   Tobacco Use  . Smoking status: Never Smoker  . Smokeless tobacco: Never Used  Vaping Use  . Vaping Use: Never used  Substance Use Topics  . Alcohol use: Never  . Drug use: Never     Allergies     Patient has no known allergies.   Review of Systems Review of Systems PER HPI    Physical Exam Triage Vital Signs ED Triage Vitals  Enc Vitals Group     BP 01/20/20 0929 (!) 100/57     Pulse Rate 01/20/20 0929 68     Resp 01/20/20 0929 16     Temp 01/20/20 0929 98.5 F (36.9 C)     Temp Source 01/20/20 0929 Oral     SpO2 01/20/20 0929 100 %     Weight --      Height --      Head Circumference --      Peak Flow --      Pain Score 01/20/20 0939 0     Pain Loc --      Pain Edu? --      Excl. in GC? --    No data found.  Updated Vital Signs BP (!) 100/57 (BP Location: Left Arm)   Pulse 68   Temp 98.5 F (36.9 C) (Oral)   Resp 16   LMP 01/05/2020 (Approximate)   SpO2 100%   Breastfeeding No   Visual Acuity Right Eye Distance:   Left  Eye Distance:   Bilateral Distance:    Right Eye Near:   Left Eye Near:    Bilateral Near:     Physical Exam Vitals and nursing note reviewed.  Constitutional:      Appearance: Normal appearance. She is not ill-appearing.  HENT:     Head: Atraumatic.  Eyes:     Extraocular Movements: Extraocular movements intact.     Conjunctiva/sclera: Conjunctivae normal.  Cardiovascular:     Rate and Rhythm: Normal rate and regular rhythm.     Heart sounds: Normal heart sounds.  Pulmonary:     Effort: Pulmonary effort is normal.     Breath sounds: Normal breath sounds.  Musculoskeletal:        General: Normal range of motion.     Cervical back: Normal range of motion and neck supple.  Skin:    General: Skin is warm and dry.     Findings: Rash (scattered clusters of erythematous papules upper arms and upper back) present.  Neurological:     Mental Status: She is alert and oriented to person, place, and time.  Psychiatric:        Mood and Affect: Mood normal.        Thought Content: Thought content normal.        Judgment: Judgment normal.      UC Treatments / Results  Labs (all labs ordered are listed, but only abnormal  results are displayed) Labs Reviewed - No data to display  EKG   Radiology No results found.  Procedures Procedures (including critical care time)  Medications Ordered in UC Medications - No data to display  Initial Impression / Assessment and Plan / UC Course  I have reviewed the triage vital signs and the nursing notes.  Pertinent labs & imaging results that were available during my care of the patient were reviewed by me and considered in my medical decision making (see chart for details).     Rash concerning for environmental trigger such as pests in the home given appearance and clustering as well as son with similar bumps coming up. Discussed treating the home if needed, zyrtec and triamcinolone cream prn. F/u with PCP if not resolving   Final Clinical Impressions(s) / UC Diagnoses   Final diagnoses:  Rash   Discharge Instructions   None    ED Prescriptions    Medication Sig Dispense Auth. Provider   cetirizine (ZYRTEC ALLERGY) 10 MG tablet Take 1 tablet (10 mg total) by mouth daily. 30 tablet Particia Nearing, [place]   triamcinolone cream (KENALOG) 0.1 % Apply 1 application topically 2 (two) times daily as needed. 60 g Particia Nearing, [place]     PDMP not reviewed this encounter.   Particia Nearing, [place] 01/20/20 1031

## 2020-01-20 NOTE — ED Triage Notes (Signed)
Pt c/o itching rash to arms, feet, back, abdomen for approx 2-3 weeks. States initially started 6 mos ago, but worse the past several weeks.  Denies SOB, facial swelling or other c/o. No OTC meds used for sx.  No known allergies to foods, medications.  States she ate shrimp and thought the rash may have been triggered by the shellfish, stopped eating shellfish and rash has not improved.  No recent changes to lotions, soaps, etc. Large raised whelps noted sporadically to arms, back.

## 2020-08-21 ENCOUNTER — Other Ambulatory Visit: Payer: Self-pay | Admitting: Family Medicine

## 2021-03-30 ENCOUNTER — Ambulatory Visit (HOSPITAL_COMMUNITY)
Admission: EM | Admit: 2021-03-30 | Discharge: 2021-03-30 | Disposition: A | Discharge status: Home / Self Care | Payer: Medicaid Other

## 2021-03-30 ENCOUNTER — Encounter (HOSPITAL_COMMUNITY): Payer: Self-pay

## 2021-03-30 DIAGNOSIS — M67432 Ganglion, left wrist: Secondary | ICD-10-CM

## 2021-03-30 NOTE — ED Triage Notes (Signed)
Pt c/o a raised knot to lt wrist x6 months or more.

## 2021-03-30 NOTE — Discharge Instructions (Signed)
Call Dr. Merrilee Seashore office for an appointment

## 2021-03-30 NOTE — ED Provider Notes (Signed)
[facility]    CSN: [id number] Arrival date & time: 03/30/21  3545      History   Chief Complaint Chief Complaint  Patient presents with   Wrist Pain    HPI Deborah Cardenas is a 35 y.o. female.  Patient reports an abscess on her dorsal left wrist for several months.  It first it was not painful, but recently it is started to become painful and much larger.  This visit was conducted with the help of an audio [place] interpreter.   Wrist Pain   History reviewed. No pertinent past medical history.  Patient Active Problem List   Diagnosis Date Noted   IUD (intrauterine device) in place 09/06/2018   Language barrier 03/31/2018    Past Surgical History:  Procedure Laterality Date   NO PAST SURGERIES      OB History     Gravida  2   Para  1   Term  1   Preterm      AB      Living  1      SAB      IAB      Ectopic      Multiple      Live Births  1            Home Medications    Prior to Admission medications   Not on File    Family History History reviewed. No pertinent family history.  Social History Social History   Tobacco Use   Smoking status: Never   Smokeless tobacco: Never  Vaping Use   Vaping Use: Never used  Substance Use Topics   Alcohol use: Never   Drug use: Never     Allergies   Patient has no known allergies.   Review of Systems Review of Systems   Physical Exam Triage Vital Signs ED Triage Vitals [03/30/21 1118]  Enc Vitals Group     BP 101/69     Pulse Rate 69     Resp 18     Temp 98.5 F (36.9 C)     Temp Source Oral     SpO2 99 %     Weight      Height      Head Circumference      Peak Flow      Pain Score      Pain Loc      Pain Edu?      Excl. in GC?    No data found.  Updated Vital Signs BP 101/69 (BP Location: Right Arm)   Pulse 69   Temp 98.5 F (36.9 C) (Oral)   Resp 18   SpO2 99%   Visual Acuity Right Eye Distance:   Left Eye Distance:   Bilateral  Distance:    Right Eye Near:   Left Eye Near:    Bilateral Near:     Physical Exam Constitutional:      General: She is not in acute distress.    Appearance: Normal appearance. She is not ill-appearing.  Pulmonary:     Effort: Pulmonary effort is normal.  Musculoskeletal:     Left wrist: No swelling or bony tenderness.       Hands:  Skin:    Findings: No erythema.  Neurological:     Mental Status: She is alert.     Gait: Gait normal.     UC Treatments / Results  Labs (all labs ordered are listed, but only abnormal results  are displayed) Labs Reviewed - No data to display  EKG   Radiology No results found.  Procedures Procedures (including critical care time)  Medications Ordered in UC Medications - No data to display  Initial Impression / Assessment and Plan / UC Course  I have reviewed the triage vital signs and the nursing notes.  Pertinent labs & imaging results that were available during my care of the patient were reviewed by me and considered in my medical decision making (see chart for details).    Likely ganglion cyst.  Referred to hand surgeon.  Final Clinical Impressions(s) / UC Diagnoses   Final diagnoses:  Ganglion cyst of dorsum of left wrist     Discharge Instructions      Call Dr. Merrilee Seashore office for an appointment   ED Prescriptions   None    PDMP not reviewed this encounter.   Cathlyn Parsons, NP 03/30/21 1153

## 2021-07-16 ENCOUNTER — Other Ambulatory Visit: Payer: Self-pay

## 2021-07-16 ENCOUNTER — Ambulatory Visit (HOSPITAL_COMMUNITY)
Admission: EM | Admit: 2021-07-16 | Discharge: 2021-07-16 | Disposition: A | Discharge status: Home / Self Care | Payer: Medicaid Other | Attending: Internal Medicine | Admitting: Internal Medicine

## 2021-07-16 ENCOUNTER — Encounter (HOSPITAL_COMMUNITY): Payer: Self-pay | Admitting: *Deleted

## 2021-07-16 DIAGNOSIS — R21 Rash and other nonspecific skin eruption: Secondary | ICD-10-CM

## 2021-07-16 LAB — POC URINE PREG, ED: Preg Test, Ur: NEGATIVE

## 2021-07-16 MED ORDER — PREDNISONE 10 MG PO TABS: ORAL_TABLET | ORAL | 0 refills | Status: DC

## 2021-07-16 MED ORDER — FAMOTIDINE 20 MG PO TABS: 20.0000 mg | ORAL_TABLET | Freq: Every day | ORAL | 0 refills | Status: AC

## 2021-07-16 MED ORDER — CETIRIZINE HCL 10 MG PO TABS: 10.0000 mg | ORAL_TABLET | Freq: Every day | ORAL | 0 refills | Status: AC

## 2021-07-16 MED ORDER — METHYLPREDNISOLONE SODIUM SUCC 125 MG IJ SOLR
80.0000 mg | Freq: Once | INTRAMUSCULAR | Status: AC
  Administered 2021-07-16: 80 mg via INTRAMUSCULAR

## 2021-07-16 MED ORDER — METHYLPREDNISOLONE SODIUM SUCC 125 MG IJ SOLR
INTRAMUSCULAR | Status: AC
  Filled 2021-07-16: qty 2

## 2021-07-16 NOTE — ED Provider Notes (Signed)
?[facility] ? ? ? ?CSN: [id number] ?Arrival date & time: 07/16/21  1032 ? ? ?  ? ?History   ?Chief Complaint ?Chief Complaint  ?Patient presents with  ? Pruritis  ? Rash  ? ? ?HPI ?Deborah Cardenas is a 36 y.o. female.  ? ?The patient is a 36 year old female who presents with rash and itching for the past month.  Interpretation services was used during this visit.  The patient states that the rash has worsened and is now starting to spread.  States that the rash started on her back and has now moved to her arms, chest, and to her lower extremities.  She reports that she did start collagen and read that one of the side effects was itching.  She stopped that medication about 2 weeks ago and states some improvement in her symptoms.  Denies fever, chills, shortness of breath, difficulty breathing, use of new soaps, detergents, foods, or environmental exposures.  Patient has tried to use hydrocortisone cream for her symptoms with minimal relief. ? ? ? ?History reviewed. No pertinent past medical history. ? ?Patient Active Problem List  ? Diagnosis Date Noted  ? IUD (intrauterine device) in place 09/06/2018  ? Language barrier 03/31/2018  ? ? ?Past Surgical History:  ?Procedure Laterality Date  ? NO PAST SURGERIES    ? ? ?OB History   ? ? Gravida  ?2  ? Para  ?1  ? Term  ?1  ? Preterm  ?   ? AB  ?   ? Living  ?1  ?  ? ? SAB  ?   ? IAB  ?   ? Ectopic  ?   ? Multiple  ?   ? Live Births  ?1  ?   ?  ?  ? ? ? ?Home Medications   ? ?Prior to Admission medications   ?Not on File  ? ? ?Family History ?History reviewed. No pertinent family history. ? ?Social History ?Social History  ? ?Tobacco Use  ? Smoking status: Never  ? Smokeless tobacco: Never  ?Vaping Use  ? Vaping Use: Never used  ?Substance Use Topics  ? Alcohol use: Never  ? Drug use: Never  ? ? ? ?Allergies   ?Patient has no known allergies. ? ? ?Review of Systems ?Review of Systems  ?Constitutional: Negative.   ?HENT: Negative.    ?Respiratory: Negative.     ?Cardiovascular: Negative.   ?Skin:  Positive for rash.  ?Psychiatric/Behavioral: Negative.    ? ? ?Physical Exam ?Triage Vital Signs ?ED Triage Vitals  ?Enc Vitals Group  ?   BP 07/16/21 1217 107/60  ?   Pulse Rate 07/16/21 1217 64  ?   Resp 07/16/21 1217 18  ?   Temp 07/16/21 1217 99 ?F (37.2 ?C)  ?   Temp src --   ?   SpO2 07/16/21 1217 100 %  ?   Weight --   ?   Height --   ?   Head Circumference --   ?   Peak Flow --   ?   Pain Score 07/16/21 1215 0  ?   Pain Loc --   ?   Pain Edu? --   ?   Excl. in GC? --   ? ?No data found. ? ?Updated Vital Signs ?BP 107/60   Pulse 64   Temp 99 ?F (37.2 ?C)   Resp 18   LMP  (LMP Unknown)   SpO2 100%  ? ?Visual Acuity ?  Right Eye Distance:   ?Left Eye Distance:   ?Bilateral Distance:   ? ?Right Eye Near:   ?Left Eye Near:    ?Bilateral Near:    ? ?Physical Exam ?Vitals reviewed.  ?Constitutional:   ?   General: She is not in acute distress. ?   Appearance: Normal appearance.  ?HENT:  ?   Head: Normocephalic and atraumatic.  ?   Right Ear: Tympanic membrane, ear canal and external ear normal.  ?   Left Ear: Tympanic membrane, ear canal and external ear normal.  ?   Nose: Nose normal.  ?   Mouth/Throat:  ?   Mouth: Mucous membranes are moist.  ?Eyes:  ?   Extraocular Movements: Extraocular movements intact.  ?   Conjunctiva/sclera: Conjunctivae normal.  ?   Pupils: Pupils are equal, round, and reactive to light.  ?Cardiovascular:  ?   Rate and Rhythm: Normal rate and regular rhythm.  ?   Pulses: Normal pulses.  ?   Heart sounds: Normal heart sounds.  ?Pulmonary:  ?   Effort: Pulmonary effort is normal.  ?   Breath sounds: Normal breath sounds.  ?Abdominal:  ?   Palpations: Abdomen is soft.  ?   Tenderness: There is no abdominal tenderness.  ?Musculoskeletal:  ?   Cervical back: Normal range of motion.  ?Skin: ?   General: Skin is warm.  ?   Findings: Rash present. Rash is urticarial.  ?   Comments: Urticarial rash to the chest, trunk, bilateral upper extremities and  bilateral thighs. No diffuse pattern observed. Numberous erythematous patches noted, with raised areas. Maculopapular rash noted to the bilateral upper extremities.  ?Neurological:  ?   Mental Status: She is alert and oriented to person, place, and time.  ?Psychiatric:     ?   Mood and Affect: Mood normal.     ?   Behavior: Behavior normal.  ? ? ? ?UC Treatments / Results  ?Labs ?(all labs ordered are listed, but only abnormal results are displayed) ?Labs Reviewed - No data to display ? ?EKG ? ? ?Radiology ?No results found. ? ?Procedures ?Procedures (including critical care time) ? ?Medications Ordered in UC ?Medications  ?methylPREDNISolone sodium succinate (SOLU-MEDROL) 125 mg/2 mL injection 80 mg (has no administration in time range)  ? ? ?Initial Impression / Assessment and Plan / UC Course  ?I have reviewed the triage vital signs and the nursing notes. ? ?Pertinent labs & imaging results that were available during my care of the patient were reviewed by me and considered in my medical decision making (see chart for details). ? ?The patient is a 36 year old female who presents for rash and itching for the past month.  Over the past few days, the rash has worsened.  She states the rash has spread to her chest, abdomen, and bilateral upper extremities.  The rash is urticarial and maculopapular in nature.  No diffuse pattern of the rash is observed.  Patient is experiencing discomfort with the itching.  Will administer Solu-Medrol to help with her itching today.  We will start patient on prednisone taper, famotidine, and Zyrtec for her symptoms.  Patient advised she can take Benadryl at bedtime for more severe itching.  Advised patient to also purchase over-the-counter Aveeno colloidal oatmeal bath.  Supportive care was provided to the patient.  Follow-up as needed. ?Final Clinical Impressions(s) / UC Diagnoses  ? ?Final diagnoses:  ?None  ? ?Discharge Instructions   ?None ?  ? ?ED Prescriptions   ?  None ?  ? ?PDMP  not reviewed this encounter. ?  ?Abran Cantor, NP ?07/16/21 1251 ? ?

## 2021-07-16 NOTE — ED Triage Notes (Signed)
Interpreter used for visit

## 2021-07-16 NOTE — ED Triage Notes (Signed)
Pt reports a rash and itching for one month. ?

## 2021-07-16 NOTE — Discharge Instructions (Signed)
Take medication as prescribed. ?May take Benadryl 25 mg at bedtime for more severe itching.  Benadryl will make you drowsy, so no driving, operating any heavy machinery, or drinking alcohol with medication. ?Avoid hot baths or showers. ?Purchase Aveeno colloidal oatmeal bath at your local pharmacy.  I recommend using this at least twice daily to help with symptoms. ?Avoid scratching or irritating the affected areas. ? ?

## 2021-07-30 ENCOUNTER — Encounter (HOSPITAL_COMMUNITY): Payer: Self-pay

## 2021-07-30 ENCOUNTER — Ambulatory Visit (HOSPITAL_COMMUNITY)
Admission: EM | Admit: 2021-07-30 | Discharge: 2021-07-30 | Disposition: A | Discharge status: Home / Self Care | Payer: Medicaid Other | Attending: Physician Assistant | Admitting: Physician Assistant

## 2021-07-30 DIAGNOSIS — R21 Rash and other nonspecific skin eruption: Secondary | ICD-10-CM

## 2021-07-30 MED ORDER — PREDNISONE 10 MG PO TABS: ORAL_TABLET | ORAL | 0 refills | Status: AC

## 2021-07-30 NOTE — Discharge Instructions (Addendum)
Can continue with zyrtec if this is providing relief.  ? ?Recommend hydrocortisone cream and/or Aquaphor.  ?Take prednisone as directed.  ?

## 2021-07-30 NOTE — ED Triage Notes (Signed)
Pt presents today with a rash in her chest, neck, and back. Pt denies pain,, and states itching. ?

## 2021-07-30 NOTE — ED Provider Notes (Signed)
?[facility] ? ? ? ?CSN: [id number] ?Arrival date & time: 07/30/21  1225 ? ? ?  ? ?History   ?Chief Complaint ?No chief complaint on file. ? ? ?HPI ?Deborah Cardenas is a 36 y.o. female.  ? ?Pt seen two weeks ago for rash.  Reports minimal relief since that visit.  Reports some relief with prednisone, but itching returned once completing prednisone.  Reports minimal releif with zyrtec and pepcid.   ? ?States that the rash started on her back and has now moved to her arms, chest, and to her lower extremities.  She reports that she did start collagen and read that one of the side effects was itching.  She stopped that medication about 2 weeks ago and states some improvement in her symptoms.  Denies fever, chills, shortness of breath, difficulty breathing, use of new soaps, detergents, foods, or environmental exposures.  Patient has tried to use hydrocortisone cream for her symptoms with minimal relief. ? ? ?History reviewed. No pertinent past medical history. ? ?Patient Active Problem List  ? Diagnosis Date Noted  ? IUD (intrauterine device) in place 09/06/2018  ? Language barrier 03/31/2018  ? ? ?Past Surgical History:  ?Procedure Laterality Date  ? NO PAST SURGERIES    ? ? ?OB History   ? ? Gravida  ?2  ? Para  ?1  ? Term  ?1  ? Preterm  ?   ? AB  ?   ? Living  ?1  ?  ? ? SAB  ?   ? IAB  ?   ? Ectopic  ?   ? Multiple  ?   ? Live Births  ?1  ?   ?  ?  ? ? ? ?Home Medications   ? ?Prior to Admission medications   ?Medication Sig Start Date End Date Taking? Authorizing Provider  ?cetirizine (ZYRTEC) 10 MG tablet Take 1 tablet (10 mg total) by mouth daily. 07/16/21 08/15/21  Leath-Warren, Sadie Haber, NP  ?famotidine (PEPCID) 20 MG tablet Take 1 tablet (20 mg total) by mouth daily. 07/16/21 08/15/21  Leath-Warren, Sadie Haber, NP  ?predniSONE (DELTASONE) 10 MG tablet Take 6 tablets with breakfast on day 1. Take 5 tablets with breakfast on day 2. Take 4 tablets with breakfast on day 3. Take 3 tablets with breakfast on  day 4. Take 2 tablets with breakfast on day 5. Take 1 tablet with breakfast on day 6. 07/30/21   Ward, Tylene Fantasia, PA-C  ? ? ?Family History ?History reviewed. No pertinent family history. ? ?Social History ?Social History  ? ?Tobacco Use  ? Smoking status: Never  ? Smokeless tobacco: Never  ?Vaping Use  ? Vaping Use: Never used  ?Substance Use Topics  ? Alcohol use: Never  ? Drug use: Never  ? ? ? ?Allergies   ?Patient has no known allergies. ? ? ?Review of Systems ?Review of Systems  ?Constitutional:  Negative for chills and fever.  ?HENT:  Negative for ear pain and sore throat.   ?Eyes:  Negative for pain and visual disturbance.  ?Respiratory:  Negative for cough and shortness of breath.   ?Cardiovascular:  Negative for chest pain and palpitations.  ?Gastrointestinal:  Negative for abdominal pain and vomiting.  ?Genitourinary:  Negative for dysuria and hematuria.  ?Musculoskeletal:  Negative for arthralgias and back pain.  ?Skin:  Positive for rash. Negative for color change.  ?Neurological:  Negative for seizures and syncope.  ?All other systems reviewed and are negative. ? ? ?  Physical Exam ?Triage Vital Signs ?ED Triage Vitals  ?Enc Vitals Group  ?   BP 07/30/21 1312 90/67  ?   Pulse Rate 07/30/21 1312 (!) 59  ?   Resp 07/30/21 1312 18  ?   Temp 07/30/21 1312 98.8 ?F (37.1 ?C)  ?   Temp Source 07/30/21 1312 Oral  ?   SpO2 07/30/21 1312 100 %  ?   Weight --   ?   Height --   ?   Head Circumference --   ?   Peak Flow --   ?   Pain Score 07/30/21 1307 0  ?   Pain Loc --   ?   Pain Edu? --   ?   Excl. in GC? --   ? ?No data found. ? ?Updated Vital Signs ?BP 90/67 (BP Location: Left Arm)   Pulse (!) 59   Temp 98.8 ?F (37.1 ?C) (Oral)   Resp 18   LMP 07/23/2021   SpO2 100%  ? ?Visual Acuity ?Right Eye Distance:   ?Left Eye Distance:   ?Bilateral Distance:   ? ?Right Eye Near:   ?Left Eye Near:    ?Bilateral Near:    ? ?Physical Exam ?Vitals and nursing note reviewed.  ?Constitutional:   ?   General: She is not in  acute distress. ?   Appearance: She is well-developed.  ?HENT:  ?   Head: Normocephalic and atraumatic.  ?Eyes:  ?   Conjunctiva/sclera: Conjunctivae normal.  ?Cardiovascular:  ?   Rate and Rhythm: Normal rate and regular rhythm.  ?   Heart sounds: No murmur heard. ?Pulmonary:  ?   Effort: Pulmonary effort is normal. No respiratory distress.  ?   Breath sounds: Normal breath sounds.  ?Abdominal:  ?   Palpations: Abdomen is soft.  ?   Tenderness: There is no abdominal tenderness.  ?Musculoskeletal:     ?   General: No swelling.  ?   Cervical back: Neck supple.  ?Skin: ?   General: Skin is warm and dry.  ?   Capillary Refill: Capillary refill takes less than 2 seconds.  ?   Comments: Oval shaped dry lesions to chest, back, abdomen.   ?Neurological:  ?   Mental Status: She is alert.  ?Psychiatric:     ?   Mood and Affect: Mood normal.  ? ? ? ?UC Treatments / Results  ?Labs ?(all labs ordered are listed, but only abnormal results are displayed) ?Labs Reviewed - No data to display ? ?EKG ? ? ?Radiology ?No results found. ? ?Procedures ?Procedures (including critical care time) ? ?Medications Ordered in UC ?Medications - No data to display ? ?Initial Impression / Assessment and Plan / UC Course  ?I have reviewed the triage vital signs and the nursing notes. ? ?Pertinent labs & imaging results that were available during my care of the patient were reviewed by me and considered in my medical decision making (see chart for details). ? ?  ? ?Rash is consistent with pityriasis rosea.  Discussed with patient that it may take several weeks for rash to resolve. Will prescribe another course of prednisone as this provided good relief, but discussed with pt that we will not repeat.   ?Final Clinical Impressions(s) / UC Diagnoses  ? ?Final diagnoses:  ?Rash and nonspecific skin eruption  ? ? ? ?Discharge Instructions   ? ?  ?Can continue with zyrtec if this is providing relief.  ? ?Recommend hydrocortisone cream and/or Aquaphor.   ?  Take prednisone as directed.  ? ? ?ED Prescriptions   ? ? Medication Sig Dispense Auth. Provider  ? predniSONE (DELTASONE) 10 MG tablet Take 6 tablets with breakfast on day 1. Take 5 tablets with breakfast on day 2. Take 4 tablets with breakfast on day 3. Take 3 tablets with breakfast on day 4. Take 2 tablets with breakfast on day 5. Take 1 tablet with breakfast on day 6. 21 tablet Ward, Tylene FantasiaJessica Z, PA-C  ? ?  ? ?PDMP not reviewed this encounter. ?  ?Ward, Tylene FantasiaJessica Z, PA-C ?07/30/21 1348 ? ?

## 2023-03-22 ENCOUNTER — Emergency Department (HOSPITAL_COMMUNITY): Payer: Medicaid Other

## 2023-03-22 ENCOUNTER — Emergency Department (HOSPITAL_COMMUNITY)
Admission: EM | Admit: 2023-03-22 | Discharge: 2023-03-22 | Disposition: A | Discharge status: Home / Self Care | Payer: Medicaid Other | Attending: Emergency Medicine | Admitting: Emergency Medicine

## 2023-03-22 ENCOUNTER — Other Ambulatory Visit: Payer: Self-pay

## 2023-03-22 ENCOUNTER — Encounter (HOSPITAL_COMMUNITY): Payer: Self-pay

## 2023-03-22 DIAGNOSIS — J069 Acute upper respiratory infection, unspecified: Secondary | ICD-10-CM | POA: Diagnosis not present

## 2023-03-22 DIAGNOSIS — R059 Cough, unspecified: Secondary | ICD-10-CM | POA: Diagnosis present

## 2023-03-22 DIAGNOSIS — Z1152 Encounter for screening for COVID-19: Secondary | ICD-10-CM | POA: Insufficient documentation

## 2023-03-22 LAB — RESP PANEL BY RT-PCR (RSV, FLU A&B, COVID)  RVPGX2
Influenza A by PCR: NEGATIVE
Influenza B by PCR: NEGATIVE
Resp Syncytial Virus by PCR: NEGATIVE
SARS Coronavirus 2 by RT PCR: NEGATIVE

## 2023-03-22 LAB — PREGNANCY, URINE: Preg Test, Ur: NEGATIVE

## 2023-03-22 MED ORDER — ACETAMINOPHEN 500 MG PO TABS
1000.0000 mg | ORAL_TABLET | Freq: Once | ORAL | Status: AC
  Administered 2023-03-22: 1000 mg via ORAL
  Filled 2023-03-22: qty 2

## 2023-03-22 MED ORDER — BENZONATATE 100 MG PO CAPS: 100.0000 mg | ORAL_CAPSULE | Freq: Three times a day (TID) | ORAL | 0 refills | Status: AC

## 2023-03-22 NOTE — Discharge Instructions (Signed)
Your chest x-ray shows no evidence of pneumonia, believe that your symptoms are represent a viral illness or common cold.  Return to the ER if you have worsening shortness of breath, chest pain, or your oxygen is low.

## 2023-03-22 NOTE — ED Provider Notes (Signed)
[facility] Provider Note   CSN: [id number] Arrival date & time: 03/22/23  0944     History  Chief Complaint  Patient presents with   Cough   Generalized Body Aches    ALMADELIA UMANA is a 37 y.o. female, no pertinent past medical history, who presents to the ED secondary to shortness of breath, cough, fever, and chills that have been going on for the last 5 days.  She states since Monday, she has had a little bit of shortness of breath, but denies any chest pain, or chest pressure.  Notes that she has not had a productive cough, and felt like she has had fevers.  No known fevers or documented fevers however.  Also reports sore throat, and bodyaches.  Has not had any nausea, vomiting, diarrhea.  States she has been taking Robitussin, cold/flu and is feeling a little bit better but wanted to get checked out.  Home Medications Prior to Admission medications   Medication Sig Start Date End Date Taking? Authorizing Provider  cetirizine (ZYRTEC) 10 MG tablet Take 1 tablet (10 mg total) by mouth daily. 07/16/21 08/15/21  Leath-Warren, Sadie Haber, NP  famotidine (PEPCID) 20 MG tablet Take 1 tablet (20 mg total) by mouth daily. 07/16/21 08/15/21  Leath-Warren, Sadie Haber, NP  predniSONE (DELTASONE) 10 MG tablet Take 6 tablets with breakfast on day 1. Take 5 tablets with breakfast on day 2. Take 4 tablets with breakfast on day 3. Take 3 tablets with breakfast on day 4. Take 2 tablets with breakfast on day 5. Take 1 tablet with breakfast on day 6. 07/30/21   Ward, Tylene Fantasia, PA-C      Allergies    Patient has no known allergies.    Review of Systems   Review of Systems  Respiratory:  Positive for cough and shortness of breath.   Cardiovascular:  Negative for chest pain.    Physical Exam Updated Vital Signs BP 111/62   Pulse 93   Temp 98 F (36.7 C)   Resp 16   LMP 03/22/2023   SpO2 100%  Physical Exam Vitals and nursing note reviewed.   Constitutional:      General: She is not in acute distress.    Appearance: She is well-developed.  HENT:     Head: Normocephalic and atraumatic.     Nose: Congestion present.     Mouth/Throat:     Mouth: Mucous membranes are moist.     Pharynx: No oropharyngeal exudate or posterior oropharyngeal erythema.  Eyes:     Conjunctiva/sclera: Conjunctivae normal.  Cardiovascular:     Rate and Rhythm: Normal rate and regular rhythm.     Heart sounds: No murmur heard. Pulmonary:     Effort: Pulmonary effort is normal. No respiratory distress.     Breath sounds: Normal breath sounds.  Abdominal:     Palpations: Abdomen is soft.     Tenderness: There is no abdominal tenderness.  Musculoskeletal:        General: No swelling.     Cervical back: Neck supple.  Skin:    General: Skin is warm and dry.     Capillary Refill: Capillary refill takes less than 2 seconds.  Neurological:     Mental Status: She is alert.  Psychiatric:        Mood and Affect: Mood normal.     ED Results / Procedures / Treatments   Labs (all labs ordered are listed, but only  abnormal results are displayed) Labs Reviewed  RESP PANEL BY RT-PCR (RSV, FLU A&B, COVID)  [id number]  PREGNANCY, URINE    EKG None  Radiology DG Chest 2 View  Result Date: 03/22/2023 CLINICAL DATA:  Shortness of breath, fever EXAM: CHEST - 2 VIEW COMPARISON:  None Available. FINDINGS: The heart size and mediastinal contours are within normal limits. No focal airspace consolidation, pleural effusion, or pneumothorax. The visualized skeletal structures are unremarkable. IMPRESSION: No active cardiopulmonary disease. Electronically Signed   By: Duanne Guess D.O.   On: 03/22/2023 10:31    Procedures Procedures    Medications Ordered in ED Medications  acetaminophen (TYLENOL) tablet 1,000 mg (1,000 mg Oral Given 03/22/23 1020)    ED Course/ Medical Decision Making/ A&P                                 Medical Decision  Making Patient is a well-appearing 37 year old female, here for cough, shortness of breath, and congestion, and that is been going on for the last 5 days.  She is well-appearing on exam, lungs are clear, however given persistent wet cough we will obtain a chest x-ray, and COVID/flu test.  Tylenol for body aches  Amount and/or Complexity of Data Reviewed Labs: ordered.    Details: COVID/flu negative Radiology: ordered.    Details: Chest x-ray clear  Discussion of management or test interpretation with external provider(s): Discussed with patient, COVID/flu is negative, chest x-ray is clear, this is reassuring, less likely to be pneumonia, given clear but breath sounds, negative chest x-ray.  Likely represents persistent URI, discussed conservative treatment, and she voiced understanding discharged home  Risk OTC drugs.   Final Clinical Impression(s) / ED Diagnoses Final diagnoses:  Viral upper respiratory tract infection    Rx / DC Orders ED Discharge Orders     None         Kensley Valladares, Harley Alto, PA 03/22/23 1144    Rolan Bucco, MD 03/22/23 1543

## 2023-03-22 NOTE — ED Triage Notes (Signed)
Pt arrived POV from home c/o a cough, fever, chills and body aches x5 days.
